# Patient Record
Sex: Female | Born: 1965 | Race: White | Hispanic: No | Marital: Married | Smoking: Former smoker
Health system: Southern US, Community
[De-identification: ages and names within clinical notes are randomized; demographics above are authoritative.]

## PROBLEM LIST (undated history)

## (undated) DIAGNOSIS — F32A Depression, unspecified: Secondary | ICD-10-CM

## (undated) DIAGNOSIS — F329 Major depressive disorder, single episode, unspecified: Secondary | ICD-10-CM

## (undated) DIAGNOSIS — F419 Anxiety disorder, unspecified: Secondary | ICD-10-CM

## (undated) DIAGNOSIS — T300 Burn of unspecified body region, unspecified degree: Secondary | ICD-10-CM

## (undated) HISTORY — DX: Major depressive disorder, single episode, unspecified: F32.9

## (undated) HISTORY — DX: Anxiety disorder, unspecified: F41.9

## (undated) HISTORY — DX: Depression, unspecified: F32.A

## (undated) HISTORY — PX: OTHER SURGICAL HISTORY: SHX169

## (undated) HISTORY — PX: GASTRIC BYPASS: SHX52

## (undated) HISTORY — PX: TONSILLECTOMY: SUR1361

---

## 2005-02-13 ENCOUNTER — Other Ambulatory Visit: Admission: RE | Admit: 2005-02-13 | Discharge: 2005-02-13 | Payer: Self-pay | Admitting: Obstetrics and Gynecology

## 2005-06-18 ENCOUNTER — Ambulatory Visit (HOSPITAL_COMMUNITY): Admission: RE | Admit: 2005-06-18 | Discharge: 2005-06-18 | Payer: Self-pay | Admitting: Obstetrics and Gynecology

## 2005-09-10 ENCOUNTER — Inpatient Hospital Stay (HOSPITAL_COMMUNITY): Admission: AD | Admit: 2005-09-10 | Discharge: 2005-09-12 | Payer: Self-pay | Admitting: Obstetrics and Gynecology

## 2006-12-29 ENCOUNTER — Emergency Department (HOSPITAL_COMMUNITY): Admission: EM | Admit: 2006-12-29 | Discharge: 2006-12-29 | Payer: Self-pay | Admitting: Emergency Medicine

## 2007-07-21 ENCOUNTER — Ambulatory Visit (HOSPITAL_COMMUNITY): Admission: RE | Admit: 2007-07-21 | Discharge: 2007-07-21 | Payer: Self-pay | Admitting: Family Medicine

## 2009-08-31 ENCOUNTER — Ambulatory Visit (HOSPITAL_COMMUNITY): Admission: RE | Admit: 2009-08-31 | Discharge: 2009-08-31 | Payer: Self-pay | Admitting: Family Medicine

## 2010-05-10 ENCOUNTER — Ambulatory Visit (HOSPITAL_COMMUNITY): Admission: RE | Admit: 2010-05-10 | Discharge: 2010-05-10 | Payer: Self-pay | Admitting: Family Medicine

## 2010-11-03 ENCOUNTER — Ambulatory Visit (HOSPITAL_COMMUNITY): Admission: RE | Admit: 2010-11-03 | Payer: Self-pay | Source: Home / Self Care | Admitting: Family Medicine

## 2010-11-08 ENCOUNTER — Other Ambulatory Visit: Payer: Self-pay

## 2010-11-08 DIAGNOSIS — Z139 Encounter for screening, unspecified: Secondary | ICD-10-CM

## 2010-11-14 ENCOUNTER — Ambulatory Visit (HOSPITAL_COMMUNITY)
Admission: RE | Admit: 2010-11-14 | Discharge: 2010-11-14 | Disposition: A | Discharge status: Home / Self Care | Payer: PRIVATE HEALTH INSURANCE | Source: Ambulatory Visit | Attending: Family Medicine | Admitting: Family Medicine

## 2010-11-14 DIAGNOSIS — Z139 Encounter for screening, unspecified: Secondary | ICD-10-CM

## 2010-11-14 DIAGNOSIS — Z1231 Encounter for screening mammogram for malignant neoplasm of breast: Secondary | ICD-10-CM | POA: Insufficient documentation

## 2011-01-29 ENCOUNTER — Other Ambulatory Visit (HOSPITAL_COMMUNITY): Payer: Self-pay | Admitting: Family Medicine

## 2011-01-29 DIAGNOSIS — Z139 Encounter for screening, unspecified: Secondary | ICD-10-CM

## 2011-02-05 ENCOUNTER — Emergency Department (HOSPITAL_COMMUNITY)
Admission: EM | Admit: 2011-02-05 | Discharge: 2011-02-05 | Disposition: A | Discharge status: Home / Self Care | Payer: Self-pay | Attending: Emergency Medicine | Admitting: Emergency Medicine

## 2011-02-05 DIAGNOSIS — Z139 Encounter for screening, unspecified: Secondary | ICD-10-CM | POA: Insufficient documentation

## 2011-02-05 LAB — DIFFERENTIAL
Basophils Absolute: 0 10*3/uL (ref 0.0–0.1)
Basophils Relative: 1 % (ref 0–1)
Eosinophils Absolute: 0.2 10*3/uL (ref 0.0–0.7)
Lymphocytes Relative: 21 % (ref 12–46)
Lymphs Abs: 1.6 10*3/uL (ref 0.7–4.0)
Monocytes Absolute: 0.7 10*3/uL (ref 0.1–1.0)
Monocytes Relative: 9 % (ref 3–12)
Neutro Abs: 5.1 10*3/uL (ref 1.7–7.7)

## 2011-02-05 LAB — BASIC METABOLIC PANEL
CO2: 23 mEq/L (ref 19–32)
Calcium: 9.3 mg/dL (ref 8.4–10.5)
Creatinine, Ser: 1.23 mg/dL — ABNORMAL HIGH (ref 0.4–1.2)
GFR calc Af Amer: 57 mL/min — ABNORMAL LOW (ref >60)
Potassium: 3.7 mEq/L (ref 3.5–5.1)

## 2011-02-05 LAB — CBC
MCH: 29.4 pg (ref 26.0–34.0)
MCHC: 31.9 g/dL (ref 30.0–36.0)
MCV: 92.1 fL (ref 78.0–100.0)

## 2011-02-05 LAB — RAPID URINE DRUG SCREEN, HOSP PERFORMED
Amphetamines: NOT DETECTED
Tetrahydrocannabinol: POSITIVE — AB

## 2011-02-23 NOTE — Discharge Summary (Signed)
[name], [name] NO.:  [id number]   MEDICAL RECORD NO.:  [id number]          PATIENT TYPE:  INP   LOCATION:  9127                          FACILITY:  [facility]   PHYSICIAN:  Malva Limes, M.D.    DATE OF BIRTH:  1966-08-04   DATE OF ADMISSION:  09/10/2005  DATE OF DISCHARGE:  09/12/2005                                 DISCHARGE SUMMARY   FINAL DIAGNOSES:  1.  Intrauterine gestation at term.  2.  Induction.  3.  Positive group B strep.  4.  Vacuum-assisted vaginal delivery.Marland Kitchen   PROCEDURE:  Vacuum-assisted vaginal delivery of a female infant Apgars of 8  and 9, delivery performed by Dr. Carrington Clamp.  Complications:  None.   This 45 year old G2, P0-0-1-0, was admitted for induction at term.  Her  antepartum course had been complicated by lithium exposure in the first  trimester.  The patient did have a fetal echo that was negative for defects.  She was also advanced maternal age and declined all tests including,  amniocentesis.  She did have anatomy scan at [facility], which was normal.  The  patient also was on this lithium for a rage personality disorder, probably  as well as depression and anxiety.  She has been stable on Zoloft throughout  her pregnancy.  I think the patient also had a history of drug abuse.  Denied any during pregnancy except for some early marijuana use, which she  said she stopped during her pregnancy.  Otherwise the patient's antepartum  course was just complicated by a positive group B strep culture.   She was admitted this time, started on penicillin for her positive group B  strep status.  AROM was performed with clear fluid.  She dilated to complete  and complete.  She pushed for about an hour and half.  The baby was started  to notice to suffer from some  decelerations with each contraction.  At this  point a vacuum extractor was placed and with two contractions and two pop-  offs, the patient finally pushed the baby out on the second  contraction.  She had a spontaneous vaginal delivery of an 8 pound 15 ounces female infant  without Apgars of 8 and 9.  There was a second-degree midline episiotomy  that was made with a third degree extension.  This was all repaired.  Delivery went without complications.  The patient's postpartum course was  benign without significant fevers.  She was seen by the clinical social work  team to talk about her depression and the risk of increasing her chances of  having postpartum depression.  Otherwise the patient was felt ready for  discharge on postpartum day #2, was sent home on a regular diet, told to  decrease activities, told to continue her prenatal vitamins and Zoloft 100  mg daily, was given Percocet one to two every four hours as needed for pain.  Was to follow up in the office in four to six weeks.   DISCHARGE LABORATORY DATA:  The patient had a hemoglobin of 11.2, white  blood cell count  of 16.7, platelets of 250,000.  The patient did not need  RhoGAM given even though she was Rh negative because the baby was Rh  negative as well.      Leilani Able, P.A.-C.    ______________________________  Malva Limes, M.D.    MB/MEDQ  D:  10/25/2005  T:  10/25/2005  Job:  [id number]

## 2011-04-19 ENCOUNTER — Ambulatory Visit (HOSPITAL_COMMUNITY): Payer: PRIVATE HEALTH INSURANCE | Admitting: Psychiatry

## 2011-10-24 ENCOUNTER — Other Ambulatory Visit (HOSPITAL_COMMUNITY): Payer: Self-pay | Admitting: Family Medicine

## 2011-10-24 DIAGNOSIS — Z139 Encounter for screening, unspecified: Secondary | ICD-10-CM

## 2011-11-26 ENCOUNTER — Ambulatory Visit (HOSPITAL_COMMUNITY)
Admission: RE | Admit: 2011-11-26 | Discharge: 2011-11-26 | Disposition: A | Discharge status: Home / Self Care | Payer: BC Managed Care – PPO | Source: Ambulatory Visit | Attending: Family Medicine | Admitting: Family Medicine

## 2011-11-26 DIAGNOSIS — Z1231 Encounter for screening mammogram for malignant neoplasm of breast: Secondary | ICD-10-CM | POA: Insufficient documentation

## 2011-11-26 DIAGNOSIS — Z139 Encounter for screening, unspecified: Secondary | ICD-10-CM

## 2013-01-20 ENCOUNTER — Other Ambulatory Visit (HOSPITAL_COMMUNITY): Payer: Self-pay | Admitting: Family Medicine

## 2013-01-20 DIAGNOSIS — Z139 Encounter for screening, unspecified: Secondary | ICD-10-CM

## 2013-01-27 ENCOUNTER — Ambulatory Visit (HOSPITAL_COMMUNITY)
Admission: RE | Admit: 2013-01-27 | Discharge: 2013-01-27 | Disposition: A | Discharge status: Home / Self Care | Payer: BC Managed Care – PPO | Source: Ambulatory Visit | Attending: Family Medicine | Admitting: Family Medicine

## 2013-01-27 DIAGNOSIS — Z139 Encounter for screening, unspecified: Secondary | ICD-10-CM

## 2013-01-27 DIAGNOSIS — Z1231 Encounter for screening mammogram for malignant neoplasm of breast: Secondary | ICD-10-CM | POA: Insufficient documentation

## 2013-03-10 ENCOUNTER — Ambulatory Visit (INDEPENDENT_AMBULATORY_CARE_PROVIDER_SITE_OTHER): Payer: BC Managed Care – PPO | Admitting: Psychology

## 2013-03-10 DIAGNOSIS — F632 Kleptomania: Secondary | ICD-10-CM

## 2013-03-10 DIAGNOSIS — F3130 Bipolar disorder, current episode depressed, mild or moderate severity, unspecified: Secondary | ICD-10-CM

## 2013-03-10 DIAGNOSIS — F411 Generalized anxiety disorder: Secondary | ICD-10-CM

## 2013-03-20 ENCOUNTER — Encounter (HOSPITAL_COMMUNITY): Payer: Self-pay | Admitting: Psychology
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
## 2013-04-15 ENCOUNTER — Ambulatory Visit (HOSPITAL_COMMUNITY): Payer: Self-pay | Admitting: Psychology

## 2013-06-17 ENCOUNTER — Ambulatory Visit (INDEPENDENT_AMBULATORY_CARE_PROVIDER_SITE_OTHER): Payer: BC Managed Care – PPO | Admitting: Psychology

## 2013-06-17 DIAGNOSIS — F411 Generalized anxiety disorder: Secondary | ICD-10-CM

## 2013-06-17 DIAGNOSIS — F3130 Bipolar disorder, current episode depressed, mild or moderate severity, unspecified: Secondary | ICD-10-CM

## 2013-06-17 DIAGNOSIS — F632 Kleptomania: Secondary | ICD-10-CM

## 2013-06-26 DIAGNOSIS — F632 Kleptomania: Secondary | ICD-10-CM | POA: Insufficient documentation

## 2013-06-26 DIAGNOSIS — F319 Bipolar disorder, unspecified: Secondary | ICD-10-CM | POA: Insufficient documentation

## 2013-08-21 ENCOUNTER — Encounter (HOSPITAL_COMMUNITY): Payer: Self-pay | Admitting: Psychology
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
## 2013-12-23 ENCOUNTER — Other Ambulatory Visit (HOSPITAL_COMMUNITY): Payer: Self-pay | Admitting: Family Medicine

## 2013-12-23 DIAGNOSIS — Z139 Encounter for screening, unspecified: Secondary | ICD-10-CM

## 2014-01-28 ENCOUNTER — Ambulatory Visit (HOSPITAL_COMMUNITY): Payer: Self-pay

## 2014-02-02 ENCOUNTER — Ambulatory Visit (HOSPITAL_COMMUNITY)
Admission: RE | Admit: 2014-02-02 | Discharge: 2014-02-02 | Disposition: A | Discharge status: Home / Self Care | Payer: PRIVATE HEALTH INSURANCE | Source: Ambulatory Visit | Attending: Family Medicine | Admitting: Family Medicine

## 2014-02-02 DIAGNOSIS — Z1231 Encounter for screening mammogram for malignant neoplasm of breast: Secondary | ICD-10-CM | POA: Insufficient documentation

## 2014-02-02 DIAGNOSIS — Z139 Encounter for screening, unspecified: Secondary | ICD-10-CM

## 2015-01-03 ENCOUNTER — Other Ambulatory Visit (HOSPITAL_COMMUNITY): Payer: Self-pay | Admitting: Family Medicine

## 2015-01-03 DIAGNOSIS — Z1231 Encounter for screening mammogram for malignant neoplasm of breast: Secondary | ICD-10-CM

## 2015-02-14 ENCOUNTER — Ambulatory Visit (HOSPITAL_COMMUNITY): Payer: Self-pay

## 2015-03-02 ENCOUNTER — Ambulatory Visit (HOSPITAL_COMMUNITY): Payer: Self-pay

## 2015-03-03 ENCOUNTER — Telehealth: Payer: Self-pay

## 2015-03-03 NOTE — Telephone Encounter (Signed)
Pt was referred by Rowan Blase, PA for a screening colonoscopy. LMOM for a return call.

## 2015-03-04 NOTE — Telephone Encounter (Signed)
PT left VM today that she is ready to schedule colonoscopy.

## 2015-03-08 NOTE — Telephone Encounter (Signed)
I called and was triaging the pt and her dog got loose, said she will call back.

## 2015-03-09 ENCOUNTER — Ambulatory Visit (HOSPITAL_COMMUNITY): Payer: Self-pay

## 2015-03-10 NOTE — Telephone Encounter (Signed)
SUPREP SPLIT DOSING- full LIQUIDS WITH BREAKFAST.   Full Liquid Diet A high-calorie, high-protein supplement should be used to meet your nutritional requirements when the full liquid diet is continued for more than 2 or 3 days. If this diet is to be used for an extended period of time (more than 7 days), a multivitamin should be considered.  Breads and Starches  Allowed: None are allowed except crackers WHOLE OR pureed (made into a thick, smooth soup) in soup.   Avoid: Any others.    Potatoes/Pasta/Rice  Allowed: ANY ITEM AS A SOUP OR SMALL PLATE OF MASHED POTATOES.       Vegetables  Allowed: Strained tomato or vegetable juice. Vegetables pureed in soup.   Avoid: Any others.    Fruit  Allowed: Any strained fruit juices and fruit drinks. Include 1 serving of citrus or vitamin C-enriched fruit juice daily.   Avoid: Any others.  Meat and Meat Substitutes  Allowed: Egg  Avoid: Any meat, fish, or fowl. All cheese.  Milk  Allowed: Milk beverages, including milk shakes and instant breakfast mixes. Smooth yogurt.   Avoid: Any others. Avoid dairy products if not tolerated.    Soups and Combination Foods  Allowed: Broth, strained cream soups. Strained, broth-based soups.   Avoid: Any others.    Desserts and Sweets  Allowed: flavored gelatin,plain ice cream, sherbet, smooth pudding, junket, fruit ices, frozen ice pops, pudding pops,, frozen fudge pops, chocolate syrup. Sugar, honey, jelly, syrup.   Avoid: Any others.  Fats and Oils  Allowed: Margarine, butter, cream, sour cream, oils.   Avoid: Any others.  Beverages  Allowed: All.   Avoid: None.  Condiments  Allowed: Iodized salt, pepper, spices, flavorings. Cocoa powder.   Avoid: Any others.    SAMPLE MEAL PLAN Breakfast   cup orange juice.   1 OR 2 EGGS   1 cup  milk.   1 cup beverage (coffee or tea).   Cream or sugar, if desired.    Midmorning Snack  2 SCRAMBLED OR HARD BOILED  EGG   Lunch  1 cup cream soup.    cup fruit juice.   1 cup milk.    cup custard.   1 cup beverage (coffee or tea).   Cream or sugar, if desired.    Midafternoon Snack  1 cup milk shake.  Dinner  1 cup cream soup.    cup fruit juice.   1 cup milk.    cup pudding.   1 cup beverage (coffee or tea).   Cream or sugar, if desired.  Evening Snack  1 cup supplement.  To increase calories, add sugar, cream, butter, or margarine if possible. Nutritional supplements will also increase the total calories.

## 2015-03-10 NOTE — Telephone Encounter (Signed)
Gastroenterology Pre-Procedure Review  Request Date: 03/03/2015 Requesting Physician: Rowan Blase, PA  PATIENT REVIEW QUESTIONS: The patient responded to the following health history questions as indicated:    1. Diabetes Melitis: no 2. Joint replacements in the past 12 months: no 3. Major health problems in the past 3 months: no 4. Has an artificial valve or MVP: no 5. Has a defibrillator: no 6. Has been advised in past to take antibiotics in advance of a procedure like teeth cleaning: no    MEDICATIONS & ALLERGIES:    Patient reports the following regarding taking any blood thinners:   Plavix? no Aspirin? no Coumadin? no  Patient confirms/reports the following medications:  Current Outpatient Prescriptions  Medication Sig Dispense Refill  . buPROPion (WELLBUTRIN SR) 200 MG 12 hr tablet Take 150 mg by mouth daily. Pt SAID SHE IS TAKING 150 MG IN THE AM DAILY    . calcium carbonate (OS-CAL) 600 MG TABS tablet Take 600 mg by mouth daily with breakfast.    . lithium carbonate 300 MG capsule Take 300 mg by mouth 2 (two) times daily with a meal.     . Multiple Vitamin (MULTIVITAMIN) tablet Take 1 tablet by mouth daily.     No current facility-administered medications for this visit.    Patient confirms/reports the following allergies:  No Known Allergies  No orders of the defined types were placed in this encounter.    AUTHORIZATION INFORMATION Primary Insurance:  ID #:   Group #:  Pre-Cert / Auth required:  Pre-Cert / Auth #:   Secondary Insurance:  ID #:   Group #:  Pre-Cert / Auth required:  Pre-Cert / Auth #:   SCHEDULE INFORMATION: Procedure has been scheduled as follows:  Date:              Time:   Location:   This Gastroenterology Pre-Precedure Review Form is being routed to the following provider(s): Barney Drain, MD

## 2015-03-14 ENCOUNTER — Other Ambulatory Visit: Payer: Self-pay

## 2015-03-14 DIAGNOSIS — Z1211 Encounter for screening for malignant neoplasm of colon: Secondary | ICD-10-CM

## 2015-03-14 NOTE — Telephone Encounter (Signed)
Pt is scheduled for 03/25/2015 at 1:30 PM with Dr. Oneida Alar.

## 2015-03-15 MED ORDER — NA SULFATE-K SULFATE-MG SULF 17.5-3.13-1.6 GM/177ML PO SOLN: 1.0000 | ORAL | Status: DC

## 2015-03-15 NOTE — Telephone Encounter (Signed)
Rx sent to the pharmacy and instructions mailed to pt.  

## 2015-03-18 ENCOUNTER — Ambulatory Visit (HOSPITAL_COMMUNITY)
Admission: RE | Admit: 2015-03-18 | Discharge: 2015-03-18 | Disposition: A | Discharge status: Home / Self Care | Payer: PRIVATE HEALTH INSURANCE | Source: Ambulatory Visit | Attending: Family Medicine | Admitting: Family Medicine

## 2015-03-18 DIAGNOSIS — Z1231 Encounter for screening mammogram for malignant neoplasm of breast: Secondary | ICD-10-CM | POA: Insufficient documentation

## 2015-03-23 ENCOUNTER — Other Ambulatory Visit: Payer: Self-pay | Admitting: Family Medicine

## 2015-03-23 DIAGNOSIS — R928 Other abnormal and inconclusive findings on diagnostic imaging of breast: Secondary | ICD-10-CM

## 2015-03-29 ENCOUNTER — Telehealth: Payer: Self-pay

## 2015-03-29 NOTE — Telephone Encounter (Signed)
I called [employer] and spoke to Mrs. Reino Bellis who said a PA is not required for screening colonoscopy. Reference # [id number].

## 2015-04-03 ENCOUNTER — Telehealth: Payer: Self-pay | Admitting: Internal Medicine

## 2015-04-03 NOTE — Telephone Encounter (Signed)
Patient called this afternoon. Admitted that she did not read instructions completely. Ate a substantial breakfast and lunch today. She called about pursuing a preparation for tomorrow's colonoscopy. I recommended we cancel her colonoscopy tomorrow and get her rescheduled. I've instructed her to call the office tomorrow to start this process.

## 2015-04-04 ENCOUNTER — Ambulatory Visit (HOSPITAL_COMMUNITY)
Admission: RE | Admit: 2015-04-04 | Payer: PRIVATE HEALTH INSURANCE | Source: Ambulatory Visit | Admitting: Gastroenterology

## 2015-04-04 ENCOUNTER — Encounter (HOSPITAL_COMMUNITY): Admission: RE | Payer: Self-pay | Source: Ambulatory Visit

## 2015-04-04 ENCOUNTER — Other Ambulatory Visit: Payer: Self-pay

## 2015-04-04 DIAGNOSIS — Z1211 Encounter for screening for malignant neoplasm of colon: Secondary | ICD-10-CM

## 2015-04-04 SURGERY — COLONOSCOPY
Anesthesia: Moderate Sedation

## 2015-04-04 NOTE — Telephone Encounter (Signed)
REVIEWED. Contact pt to reschedule.

## 2015-04-04 NOTE — Telephone Encounter (Signed)
Pt has been rescheduled for 05/02/2015 at 9:30 AM. New instructions have been mailed to her.

## 2015-04-12 ENCOUNTER — Encounter (HOSPITAL_COMMUNITY): Payer: Self-pay

## 2015-04-25 ENCOUNTER — Telehealth: Payer: Self-pay

## 2015-04-25 NOTE — Telephone Encounter (Signed)
I called pt to update her triage and she has not had any change in her medications.

## 2015-04-26 NOTE — Telephone Encounter (Signed)
REVIEWED-NO ADDITIONAL RECOMMENDATIONS. 

## 2015-05-02 ENCOUNTER — Ambulatory Visit (HOSPITAL_COMMUNITY)
Admission: RE | Admit: 2015-05-02 | Discharge: 2015-05-02 | Disposition: A | Discharge status: Home / Self Care | Payer: PRIVATE HEALTH INSURANCE | Source: Ambulatory Visit | Attending: Gastroenterology | Admitting: Gastroenterology

## 2015-05-02 ENCOUNTER — Encounter (HOSPITAL_COMMUNITY): Payer: Self-pay | Admitting: *Deleted

## 2015-05-02 ENCOUNTER — Encounter (HOSPITAL_COMMUNITY)
Admission: RE | Disposition: A | Discharge status: Home / Self Care | Payer: Self-pay | Source: Ambulatory Visit | Attending: Gastroenterology

## 2015-05-02 DIAGNOSIS — K648 Other hemorrhoids: Secondary | ICD-10-CM | POA: Insufficient documentation

## 2015-05-02 DIAGNOSIS — Z87891 Personal history of nicotine dependence: Secondary | ICD-10-CM | POA: Diagnosis not present

## 2015-05-02 DIAGNOSIS — Z1211 Encounter for screening for malignant neoplasm of colon: Secondary | ICD-10-CM | POA: Insufficient documentation

## 2015-05-02 DIAGNOSIS — D125 Benign neoplasm of sigmoid colon: Secondary | ICD-10-CM | POA: Diagnosis not present

## 2015-05-02 DIAGNOSIS — Z9884 Bariatric surgery status: Secondary | ICD-10-CM | POA: Diagnosis not present

## 2015-05-02 DIAGNOSIS — Z8371 Family history of colonic polyps: Secondary | ICD-10-CM | POA: Insufficient documentation

## 2015-05-02 DIAGNOSIS — Z79899 Other long term (current) drug therapy: Secondary | ICD-10-CM | POA: Diagnosis not present

## 2015-05-02 DIAGNOSIS — F329 Major depressive disorder, single episode, unspecified: Secondary | ICD-10-CM | POA: Insufficient documentation

## 2015-05-02 DIAGNOSIS — K635 Polyp of colon: Secondary | ICD-10-CM

## 2015-05-02 DIAGNOSIS — K621 Rectal polyp: Secondary | ICD-10-CM | POA: Insufficient documentation

## 2015-05-02 HISTORY — PX: COLONOSCOPY: SHX5424

## 2015-05-02 SURGERY — COLONOSCOPY
Anesthesia: Moderate Sedation

## 2015-05-02 MED ORDER — SODIUM CHLORIDE 0.9 % IV SOLN
INTRAVENOUS | Status: DC
  Administered 2015-05-02: 09:00:00 via INTRAVENOUS

## 2015-05-02 MED ORDER — STERILE WATER FOR IRRIGATION IR SOLN
Status: DC | PRN
  Administered 2015-05-02: 09:00:00

## 2015-05-02 MED ORDER — SODIUM CHLORIDE 0.9 % IV SOLN: INTRAVENOUS | Status: DC

## 2015-05-02 MED ORDER — MEPERIDINE HCL 100 MG/ML IJ SOLN
INTRAMUSCULAR | Status: DC | PRN
  Administered 2015-05-02 (×2): 25 mg

## 2015-05-02 MED ORDER — MIDAZOLAM HCL 5 MG/5ML IJ SOLN
INTRAMUSCULAR | Status: DC | PRN
  Administered 2015-05-02 (×2): 2 mg via INTRAVENOUS

## 2015-05-02 MED ORDER — MEPERIDINE HCL 100 MG/ML IJ SOLN
INTRAMUSCULAR | Status: AC
  Filled 2015-05-02: qty 2

## 2015-05-02 MED ORDER — MIDAZOLAM HCL 5 MG/5ML IJ SOLN
INTRAMUSCULAR | Status: AC
  Filled 2015-05-02: qty 10

## 2015-05-02 MED ORDER — PROMETHAZINE HCL 25 MG/ML IJ SOLN
25.0000 mg | Freq: Once | INTRAMUSCULAR | Status: AC
  Administered 2015-05-02: 25 mg via INTRAVENOUS

## 2015-05-02 MED ORDER — SODIUM CHLORIDE 0.9 % IJ SOLN
INTRAMUSCULAR | Status: AC
  Filled 2015-05-02: qty 3

## 2015-05-02 MED ORDER — PROMETHAZINE HCL 25 MG/ML IJ SOLN
INTRAMUSCULAR | Status: AC
  Filled 2015-05-02: qty 1

## 2015-05-02 NOTE — H&P (Signed)
  Primary Care Physician:  Purvis Kilts, MD Primary Gastroenterologist:  Dr. Oneida Alar  Pre-Procedure History & Physical: HPI:  Heather Rivas is a 49 y.o. female here for Marland.  Past Medical History  Diagnosis Date  . Anxiety   . Depression     Past Surgical History  Procedure Laterality Date  . Gastric bypass    . Tonsillectomy    . Cervical laser surgery      Prior to Admission medications   Medication Sig Start Date End Date Taking? Authorizing Provider  acetaminophen (TYLENOL) 500 MG tablet Take 1,000 mg by mouth every 6 (six) hours as needed for mild pain.   Yes Historical Provider, MD  buPROPion (WELLBUTRIN XL) 150 MG 24 hr tablet Take 150 mg by mouth daily.   Yes Historical Provider, MD  calcium carbonate (OS-CAL) 600 MG TABS tablet Take 600 mg by mouth daily with breakfast.   Yes Historical Provider, MD  lithium carbonate 300 MG capsule Take 300 mg by mouth daily.    Yes Historical Provider, MD  Multiple Vitamin (MULTIVITAMIN) tablet Take 1 tablet by mouth daily.   Yes Historical Provider, MD  Na Sulfate-K Sulfate-Mg Sulf (SUPREP BOWEL PREP) SOLN Take 1 kit by mouth as directed. 03/15/15  Yes Danie Binder, MD    Allergies as of 04/04/2015  . (No Known Allergies)    Family History  Problem Relation Age of Onset  . Alcohol abuse Father   . Alcohol abuse Paternal Uncle   . Drug abuse Paternal Uncle     History   Social History  . Marital Status: Married    Spouse Name: N/A  . Number of Children: N/A  . Years of Education: N/A   Occupational History  . Not on file.   Social History Main Topics  . Smoking status: Former Smoker -- 0.50 packs/day for 10 years    Types: Cigarettes  . Smokeless tobacco: Never Used  . Alcohol Use: No  . Drug Use: Yes    Special: Marijuana     Comment: Once weekly  . Sexual Activity: Yes   Other Topics Concern  . Not on file   Social History Narrative    Review of Systems: See HPI, otherwise  negative ROS   Physical Exam: BP 122/77 mmHg  Pulse 78  Temp(Src) 98.7 F (37.1 C) (Oral)  Resp 18  Ht $R'5\' 11"'He$  (1.803 m)  Wt 190 lb (86.183 kg)  BMI 26.51 kg/m2  SpO2 99%  LMP 04/29/2015 General:   Alert,  pleasant and cooperative in NAD Head:  Normocephalic and atraumatic. Neck:  Supple; Lungs:  Clear throughout to auscultation.    Heart:  Regular rate and rhythm. Abdomen:  Soft, nontender and nondistended. Normal bowel sounds, without guarding, and without rebound.   Neurologic:  Alert and  oriented x4;  grossly normal neurologically.  Impression/Plan:     SCREENING  Plan:  1. TCS TODAY

## 2015-05-02 NOTE — Discharge Instructions (Signed)
You had 2 polyps removed. You have SMALL internal hemorrhoids.   FOLLOW A HIGH FIBER DIET. AVOID ITEMS THAT CAUSE BLOATING & GAS. SEE INFO BELOW.  YOUR BIOPSY RESULTS WILL BE AVAILABLE IN MY CHART AFTER JUL 27 AND MY OFFICE WILL CONTACT YOU IN 10-14 DAYS WITH YOUR RESULTS.   Next colonoscopy in 5-10 years.   Colonoscopy Care After Read the instructions outlined below and refer to this sheet in the next week. These discharge instructions provide you with general information on caring for yourself after you leave the hospital. While your treatment has been planned according to the most current medical practices available, unavoidable complications occasionally occur. If you have any problems or questions after discharge, call DR. Catie Chiao, [phone].  ACTIVITY  You may resume your regular activity, but move at a slower pace for the next 24 hours.   Take frequent rest periods for the next 24 hours.   Walking will help get rid of the air and reduce the bloated feeling in your belly (abdomen).   No driving for 24 hours (because of the medicine (anesthesia) used during the test).   You may shower.   Do not sign any important legal documents or operate any machinery for 24 hours (because of the anesthesia used during the test).    NUTRITION  Drink plenty of fluids.   You may resume your normal diet as instructed by your doctor.   Begin with a light meal and progress to your normal diet. Heavy or fried foods are harder to digest and may make you feel sick to your stomach (nauseated).   Avoid alcoholic beverages for 24 hours or as instructed.    MEDICATIONS  You may resume your normal medications.   WHAT YOU CAN EXPECT TODAY  Some feelings of bloating in the abdomen.   Passage of more gas than usual.   Spotting of blood in your stool or on the toilet paper  .  IF YOU HAD POLYPS REMOVED DURING THE COLONOSCOPY:  Eat a soft diet IF YOU HAVE NAUSEA, BLOATING, ABDOMINAL  PAIN, OR VOMITING.    FINDING OUT THE RESULTS OF YOUR TEST Not all test results are available during your visit. DR. Oneida Alar WILL CALL YOU WITHIN 14 DAYS OF YOUR PROCEDUE WITH YOUR RESULTS. Do not assume everything is normal if you have not heard from DR. Savahna Casados, CALL HER OFFICE AT [phone].  SEEK IMMEDIATE MEDICAL ATTENTION AND CALL THE OFFICE: [phone] IF:  You have more than a spotting of blood in your stool.   Your belly is swollen (abdominal distention).   You are nauseated or vomiting.   You have a temperature over 101F.   You have abdominal pain or discomfort that is severe or gets worse throughout the day.   High-Fiber Diet A high-fiber diet changes your normal diet to include more whole grains, legumes, fruits, and vegetables. Changes in the diet involve replacing refined carbohydrates with unrefined foods. The calorie level of the diet is essentially unchanged. The Dietary Reference Intake (recommended amount) for adult males is 38 grams per day. For adult females, it is 25 grams per day. Pregnant and lactating women should consume 28 grams of fiber per day. Fiber is the intact part of a plant that is not broken down during digestion. Functional fiber is fiber that has been isolated from the plant to provide a beneficial effect in the body. PURPOSE  Increase stool bulk.   Ease and regulate bowel movements.   Lower cholesterol.  REDUCE  RISK OF COLON CANCER  INDICATIONS THAT YOU NEED MORE FIBER  Constipation and hemorrhoids.   Uncomplicated diverticulosis (intestine condition) and irritable bowel syndrome.   Weight management.   As a protective measure against hardening of the arteries (atherosclerosis), diabetes, and cancer.   GUIDELINES FOR INCREASING FIBER IN THE DIET  Start adding fiber to the diet slowly. A gradual increase of about 5 more grams (2 slices of whole-wheat bread, 2 servings of most fruits or vegetables, or 1 bowl of high-fiber cereal) per  day is best. Too rapid an increase in fiber may result in constipation, flatulence, and bloating.   Drink enough water and fluids to keep your urine clear or pale yellow. Water, juice, or caffeine-free drinks are recommended. Not drinking enough fluid may cause constipation.   Eat a variety of high-fiber foods rather than one type of fiber.   Try to increase your intake of fiber through using high-fiber foods rather than fiber pills or supplements that contain small amounts of fiber.   The goal is to change the types of food eaten. Do not supplement your present diet with high-fiber foods, but replace foods in your present diet.   INCLUDE A VARIETY OF FIBER SOURCES  Replace refined and processed grains with whole grains, canned fruits with fresh fruits, and incorporate other fiber sources. White rice, white breads, and most bakery goods contain little or no fiber.   Brown whole-grain rice, buckwheat oats, and many fruits and vegetables are all good sources of fiber. These include: broccoli, Brussels sprouts, cabbage, cauliflower, beets, sweet potatoes, white potatoes (skin on), carrots, tomatoes, eggplant, squash, berries, fresh fruits, and dried fruits.   Cereals appear to be the richest source of fiber. Cereal fiber is found in whole grains and bran. Bran is the fiber-rich outer coat of cereal grain, which is largely removed in refining. In whole-grain cereals, the bran remains. In breakfast cereals, the largest amount of fiber is found in those with "bran" in their names. The fiber content is sometimes indicated on the label.   You may need to include additional fruits and vegetables each day.   In baking, for 1 cup white flour, you may use the following substitutions:   1 cup whole-wheat flour minus 2 tablespoons.   1/2 cup white flour plus 1/2 cup whole-wheat flour.   Polyps, Colon  A polyp is extra tissue that grows inside your body. Colon polyps grow in the large intestine. The  large intestine, also called the colon, is part of your digestive system. It is a long, hollow tube at the end of your digestive tract where your body makes and stores stool. Most polyps are not dangerous. They are benign. This means they are not cancerous. But over time, some types of polyps can turn into cancer. Polyps that are smaller than a pea are usually not harmful. But larger polyps could someday become or may already be cancerous. To be safe, doctors remove all polyps and test them.   PREVENTION There is not one sure way to prevent polyps. You might be able to lower your risk of getting them if you:  Eat more fruits and vegetables and less fatty food.   Do not smoke.   Avoid alcohol.   Exercise every day.   Lose weight if you are overweight.   Eating more calcium and folate can also lower your risk of getting polyps. Some foods that are rich in calcium are milk, cheese, and broccoli. Some foods that are rich  in folate are chickpeas, kidney beans, and spinach.   Hemorrhoids Hemorrhoids are dilated (enlarged) veins around the rectum. Sometimes clots will form in the veins. This makes them swollen and painful. These are called thrombosed hemorrhoids. Causes of hemorrhoids include:  Constipation.   Straining to have a bowel movement.   HEAVY LIFTING  HOME CARE INSTRUCTIONS  Eat a well balanced diet and drink 6 to 8 glasses of water every day to avoid constipation. You may also use a bulk laxative.   Avoid straining to have bowel movements.   Keep anal area dry and clean.   Do not use a donut shaped pillow or sit on the toilet for long periods. This increases blood pooling and pain.   Move your bowels when your body has the urge; this will require less straining and will decrease pain and pressure.

## 2015-05-02 NOTE — Op Note (Signed)
[facility] [address] [place], [address]   COLONOSCOPY PROCEDURE REPORT  PATIENT: Heather Rivas, Heather Rivas  MR#: [id number] BIRTHDATE: 1965/11/19 , 48  yrs. old GENDER: female ENDOSCOPIST: Danie Binder, MD REFERRED [place]:GURK Hilma Favors, M.D. PROCEDURE DATE:  May 23, 2015 PROCEDURE:   Colonoscopy with cold biopsy polypectomy and Colonoscopy with snare polypectomy INDICATIONS:average risk patient for colon cancer-FATHER HAD POLYPS AGE > 60. MEDICATIONS: Promethazine (Phenergan) 25 mg IV, Demerol 50 mg IV, and Versed 4 mg IV  DESCRIPTION OF PROCEDURE:    Physical exam was performed.  Informed consent was obtained from the patient after explaining the benefits, risks, and alternatives to procedure.  The patient was connected to monitor and placed in left lateral position. Continuous oxygen was provided by nasal cannula and IV medicine administered through an indwelling cannula.  After administration of sedation and rectal exam, the patients rectum was intubated and the     colonoscope was advanced under direct visualization to the ileum.  The scope was removed slowly by carefully examining the color, texture, anatomy, and integrity mucosa on the way out.  The patient was recovered in endoscopy and discharged home in satisfactory condition. Estimated blood loss is zero unless otherwise noted in this procedure report.    COLON FINDINGS: The examined terminal ileum appeared to be normal. , The colon was redundant.  Manual abdominal counter-pressure was used to reach the cecum, A single polyp measuring 3 mm in size was found in the sigmoid colon.  A polypectomy was performed with cold forceps.  , A sessile polyp measuring 6 mm in size was found in the rectum.  A polypectomy was performed using snare cautery.  , and Small internal hemorrhoids were found.  PREP QUALITY: excellent.  CECAL W/D TIME: 10       minutes COMPLICATIONS: None  ENDOSCOPIC IMPRESSION: 1.   TWO POLYPS  REMOVED 2.   The colon was redundant 3.   Small internal hemorrhoids  RECOMMENDATIONS: AWAIT BIOPSY HIGH FIBER DIET NEXT TCS IN 5-10 YEARS   _______________________________ eSignedDanie Binder, MD 2015-05-23 10:15 AM   CPT CODES: ICD CODES:  The ICD and CPT codes recommended by this software are interpretations from the data that the clinical staff has captured with the software.  The verification of the translation of this report to the ICD and CPT codes and modifiers is the sole responsibility of the health care institution and practicing physician where this report was generated.  [employer] will not be held responsible for the validity of the ICD and CPT codes included on this report.  AMA assumes no liability for data contained or not contained herein. CPT is a Designer, television/film set of the [employer].

## 2015-05-03 ENCOUNTER — Telehealth: Payer: Self-pay | Admitting: Gastroenterology

## 2015-05-03 ENCOUNTER — Ambulatory Visit (HOSPITAL_COMMUNITY)
Admission: RE | Admit: 2015-05-03 | Discharge: 2015-05-03 | Disposition: A | Discharge status: Home / Self Care | Payer: PRIVATE HEALTH INSURANCE | Source: Ambulatory Visit | Attending: Family Medicine | Admitting: Family Medicine

## 2015-05-03 ENCOUNTER — Encounter (HOSPITAL_COMMUNITY): Payer: Self-pay | Admitting: Gastroenterology

## 2015-05-03 DIAGNOSIS — R928 Other abnormal and inconclusive findings on diagnostic imaging of breast: Secondary | ICD-10-CM | POA: Diagnosis not present

## 2015-05-03 NOTE — Telephone Encounter (Signed)
Please call pt. SHE had ONE INFLAMMATORY AND ONE polypoid lesion removed and THEY ARE benign.   FOLLOW A HIGH FIBER DIET. AVOID ITEMS THAT CAUSE BLOATING & GAS.  Next colonoscopy in 10 years.

## 2015-05-03 NOTE — Telephone Encounter (Signed)
NIC for TCS in 10 years

## 2015-05-04 NOTE — Telephone Encounter (Signed)
Pt is aware of results. 

## 2016-04-27 ENCOUNTER — Other Ambulatory Visit (HOSPITAL_COMMUNITY): Payer: Self-pay | Admitting: Family Medicine

## 2016-04-27 DIAGNOSIS — Z1231 Encounter for screening mammogram for malignant neoplasm of breast: Secondary | ICD-10-CM

## 2016-05-23 ENCOUNTER — Ambulatory Visit (HOSPITAL_COMMUNITY): Payer: Self-pay

## 2016-09-26 ENCOUNTER — Ambulatory Visit (HOSPITAL_COMMUNITY): Payer: Self-pay

## 2016-11-07 DIAGNOSIS — G43009 Migraine without aura, not intractable, without status migrainosus: Secondary | ICD-10-CM | POA: Insufficient documentation

## 2017-02-07 ENCOUNTER — Encounter (HOSPITAL_COMMUNITY): Payer: Self-pay | Admitting: Emergency Medicine

## 2017-02-07 ENCOUNTER — Emergency Department (HOSPITAL_COMMUNITY)
Admission: EM | Admit: 2017-02-07 | Discharge: 2017-02-08 | Disposition: A | Discharge status: Home / Self Care | Payer: Medicaid Other | Attending: Emergency Medicine | Admitting: Emergency Medicine

## 2017-02-07 ENCOUNTER — Emergency Department (HOSPITAL_COMMUNITY): Payer: Medicaid Other

## 2017-02-07 DIAGNOSIS — Z79899 Other long term (current) drug therapy: Secondary | ICD-10-CM | POA: Diagnosis not present

## 2017-02-07 DIAGNOSIS — R0789 Other chest pain: Secondary | ICD-10-CM | POA: Insufficient documentation

## 2017-02-07 DIAGNOSIS — Z87891 Personal history of nicotine dependence: Secondary | ICD-10-CM | POA: Diagnosis not present

## 2017-02-07 DIAGNOSIS — R079 Chest pain, unspecified: Secondary | ICD-10-CM

## 2017-02-07 LAB — CBC WITH DIFFERENTIAL/PLATELET
Basophils Absolute: 0.1 10*3/uL (ref 0.0–0.1)
Basophils Relative: 1 %
Eosinophils Absolute: 0.2 10*3/uL (ref 0.0–0.7)
Eosinophils Relative: 2 %
HCT: 35 % — ABNORMAL LOW (ref 36.0–46.0)
Hemoglobin: 11 g/dL — ABNORMAL LOW (ref 12.0–15.0)
Lymphocytes Relative: 26 %
Lymphs Abs: 2 10*3/uL (ref 0.7–4.0)
MCH: 25.3 pg — ABNORMAL LOW (ref 26.0–34.0)
MCHC: 31.4 g/dL (ref 30.0–36.0)
MCV: 80.6 fL (ref 78.0–100.0)
Monocytes Absolute: 0.9 10*3/uL (ref 0.1–1.0)
Monocytes Relative: 12 %
NEUTROS ABS: 4.6 10*3/uL (ref 1.7–7.7)
Neutrophils Relative %: 59 %
Platelets: 411 10*3/uL — ABNORMAL HIGH (ref 150–400)
RBC: 4.34 MIL/uL (ref 3.87–5.11)
RDW: 15.5 % (ref 11.5–15.5)
WBC: 7.8 10*3/uL (ref 4.0–10.5)

## 2017-02-07 LAB — BASIC METABOLIC PANEL
ANION GAP: 7 (ref 5–15)
BUN: 22 mg/dL — ABNORMAL HIGH (ref 6–20)
CHLORIDE: 113 mmol/L — AB (ref 101–111)
CO2: 19 mmol/L — ABNORMAL LOW (ref 22–32)
CREATININE: 0.93 mg/dL (ref 0.44–1.00)
Calcium: 9 mg/dL (ref 8.9–10.3)
GFR calc Af Amer: >60 mL/min (ref >60)
GFR calc non Af Amer: >60 mL/min (ref >60)
Glucose, Bld: 85 mg/dL (ref 65–99)
Potassium: 3.9 mmol/L (ref 3.5–5.1)
Sodium: 139 mmol/L (ref 135–145)

## 2017-02-07 LAB — TROPONIN I: Troponin I: <0.03 ng/mL (ref <0.03)

## 2017-02-07 NOTE — ED Provider Notes (Signed)
[facility] DEPT Provider Note   CSN: [id number] Arrival date & time: 02/07/17  2049     History   Chief Complaint Chief Complaint  Patient presents with  . Chest Pain    HPI Heather Rivas is a 51 y.o. female.  The history is provided by the patient. No language interpreter was used.  Chest Pain   This is a new problem. The problem occurs constantly. The problem has been gradually worsening. The pain is at a severity of 9/10. The pain is moderate. The pain does not radiate. Pertinent negatives include no abdominal pain and no back pain. There are no known risk factors.  Her past medical history is significant for anxiety/panic attacks.  Her family medical history is significant for CAD.  Pt reports she had chest pain for 15 minutes.  Pt reports she felt short of breath and sweaty.  Pt reports symptoms resolved after passing gas.  Pt reports she feels fine now. No pain  Past Medical History:  Diagnosis Date  . Anxiety   . Depression     Patient Active Problem List   Diagnosis Date Noted  . Special screening for malignant neoplasms, colon     Past Surgical History:  Procedure Laterality Date  . Cervical laser surgery    . COLONOSCOPY N/A 05/02/2015   Procedure: COLONOSCOPY;  Surgeon: Danie Binder, MD;  Location: [facility] SUITE;  Service: Endoscopy;  Laterality: N/A;  9:30 AM  . GASTRIC BYPASS    . TONSILLECTOMY      OB History    No data available       Home Medications    Prior to Admission medications   Medication Sig Start Date End Date Taking? Authorizing Provider  acetaminophen (TYLENOL 8 HOUR ARTHRITIS PAIN) 650 MG CR tablet Take 650 mg by mouth every 8 (eight) hours as needed for pain.   Yes Historical Provider, MD  buPROPion (WELLBUTRIN XL) 150 MG 24 hr tablet Take 300 mg by mouth daily.    Yes Historical Provider, MD  calcium carbonate (OS-CAL) 600 MG TABS tablet Take 600 mg by mouth daily with breakfast.   Yes Historical Provider, MD    diphenhydramine-acetaminophen (TYLENOL PM) 25-500 MG TABS tablet Take 1 tablet by mouth at bedtime as needed.   Yes Historical Provider, MD  hydrOXYzine (ATARAX/VISTARIL) 10 MG tablet Take 20 mg by mouth 3 (three) times daily as needed. For mild headache 11/07/16  Yes Historical Provider, MD  lithium carbonate 300 MG capsule Take 300 mg by mouth 2 (two) times daily.    Yes Historical Provider, MD  Multiple Vitamin (MULTIVITAMIN) tablet Take 1 tablet by mouth daily.   Yes Historical Provider, MD  NONFORMULARY OR COMPOUNDED ITEM Apply 1 application topically daily as needed (for arthritis in thumb(s)).   Yes Historical Provider, MD  valACYclovir (VALTREX) 500 MG tablet Take 2 tablets by mouth daily. As needed for outbreaks 01/28/17  Yes Historical Provider, MD    Family History Family History  Problem Relation Age of Onset  . Alcohol abuse Father   . Alcohol abuse Paternal Uncle   . Drug abuse Paternal Uncle     Social History Social History  Substance Use Topics  . Smoking status: Former Smoker    Packs/day: 0.50    Years: 10.00    Types: Cigarettes  . Smokeless tobacco: Never Used  . Alcohol use No     Allergies   Patient has no known allergies.   Review of Systems Review  of Systems  Cardiovascular: Positive for chest pain.  Gastrointestinal: Negative for abdominal pain.  Musculoskeletal: Negative for back pain.  All other systems reviewed and are negative.    Physical Exam Updated Vital Signs BP (!) 144/84   Pulse 74   Temp 97.6 F (36.4 C) (Oral)   Resp 20   Ht 5' 10.5" (1.791 m)   Wt 97.5 kg   LMP 06/27/2016   SpO2 98%   BMI 30.41 kg/m   Physical Exam  Constitutional: She appears well-developed and well-nourished. No distress.  HENT:  Head: Normocephalic and atraumatic.  Eyes: Conjunctivae are normal.  Neck: Neck supple.  Cardiovascular: Normal rate and regular rhythm.   No murmur heard. Pulmonary/Chest: Effort normal and breath sounds normal. No  respiratory distress.  Abdominal: Soft. There is no tenderness.  Musculoskeletal: She exhibits no edema.  Neurological: She is alert.  Skin: Skin is warm and dry.  Psychiatric: She has a normal mood and affect.  Nursing note and vitals reviewed.    ED Treatments / Results  Labs (all labs ordered are listed, but only abnormal results are displayed) Labs Reviewed  CBC WITH DIFFERENTIAL/PLATELET - Abnormal; Notable for the following:       Result Value   Hemoglobin 11.0 (*)    HCT 35.0 (*)    MCH 25.3 (*)    Platelets 411 (*)    All other components within normal limits  BASIC METABOLIC PANEL - Abnormal; Notable for the following:    Chloride 113 (*)    CO2 19 (*)    BUN 22 (*)    All other components within normal limits  TROPONIN I  TROPONIN I    EKG  EKG Interpretation None       Radiology Dg Chest 2 View  Result Date: 02/07/2017 CLINICAL DATA:  Shortness of breath and RIGHT lower chest pain for 40 minutes. EXAM: CHEST  2 VIEW COMPARISON:  None. FINDINGS: Cardiomediastinal silhouette is normal. No pleural effusions or focal consolidations. Trachea projects midline and there is no pneumothorax. Soft tissue planes and included osseous structures are non-suspicious. Mild degenerative change of the thoracic spine. Surgical clips anterior abdomen. IMPRESSION: Normal chest. Electronically Signed   By: Elon Alas M.D.   On: 02/07/2017 21:24    Procedures Procedures (including critical care time)  Medications Ordered in ED Medications - No data to display   Initial Impression / Assessment and Plan / ED Course  I have reviewed the triage vital signs and the nursing notes.  Pertinent labs & imaging results that were available during my care of the patient were reviewed by me and considered in my medical decision making (see chart for details).     Normal EKG, troponin negative x2 .   Pt has no current symptoms.  Pt advised to follow up with her MD for recheck.   Return if problems  Final Clinical Impressions(s) / ED Diagnoses   Final diagnoses:  Chest pain, unspecified type    New Prescriptions New Prescriptions   No medications on file  An After Visit Summary was printed and given to the patient.    Hollace Kinnier [place], PA-C 02/08/17 1044    Milton Ferguson, MD 02/08/17 [phone]

## 2017-02-07 NOTE — ED Triage Notes (Signed)
Pt c/o sudden chest heaviness with sob started approx 40 min ago. Pt arrived mild sob and diaphoretic and rating pain 9.  Pt has since calmed down and rating pain 4. A/o

## 2017-02-07 NOTE — ED Notes (Signed)
Patient transported to X-ray 

## 2017-02-08 LAB — TROPONIN I

## 2017-02-08 NOTE — ED Notes (Signed)
Pt ambulatory to waiting room. Pt verbalized understanding of discharge instructions.   

## 2017-02-08 NOTE — Discharge Instructions (Signed)
See your Physician for recheck.  Return if any problems.  °

## 2017-06-20 ENCOUNTER — Encounter (HOSPITAL_COMMUNITY): Payer: Self-pay | Admitting: *Deleted

## 2017-06-20 ENCOUNTER — Emergency Department (HOSPITAL_COMMUNITY)
Admission: EM | Admit: 2017-06-20 | Discharge: 2017-06-20 | Disposition: A | Discharge status: Home / Self Care | Payer: Medicaid Other | Attending: Emergency Medicine | Admitting: Emergency Medicine

## 2017-06-20 DIAGNOSIS — Z87891 Personal history of nicotine dependence: Secondary | ICD-10-CM | POA: Insufficient documentation

## 2017-06-20 DIAGNOSIS — Y990 Civilian activity done for income or pay: Secondary | ICD-10-CM | POA: Diagnosis not present

## 2017-06-20 DIAGNOSIS — T310 Burns involving less than 10% of body surface: Secondary | ICD-10-CM | POA: Diagnosis not present

## 2017-06-20 DIAGNOSIS — Z79899 Other long term (current) drug therapy: Secondary | ICD-10-CM | POA: Insufficient documentation

## 2017-06-20 DIAGNOSIS — Y93G1 Activity, food preparation and clean up: Secondary | ICD-10-CM | POA: Insufficient documentation

## 2017-06-20 DIAGNOSIS — T22132A Burn of first degree of left upper arm, initial encounter: Secondary | ICD-10-CM | POA: Diagnosis not present

## 2017-06-20 DIAGNOSIS — Y929 Unspecified place or not applicable: Secondary | ICD-10-CM | POA: Diagnosis not present

## 2017-06-20 DIAGNOSIS — T24211A Burn of second degree of right thigh, initial encounter: Secondary | ICD-10-CM | POA: Diagnosis not present

## 2017-06-20 DIAGNOSIS — S79821A Other specified injuries of right thigh, initial encounter: Secondary | ICD-10-CM | POA: Diagnosis present

## 2017-06-20 DIAGNOSIS — X12XXXA Contact with other hot fluids, initial encounter: Secondary | ICD-10-CM | POA: Insufficient documentation

## 2017-06-20 DIAGNOSIS — T300 Burn of unspecified body region, unspecified degree: Secondary | ICD-10-CM

## 2017-06-20 MED ORDER — ACETAMINOPHEN 500 MG PO TABS
1000.0000 mg | ORAL_TABLET | Freq: Once | ORAL | Status: AC
  Administered 2017-06-20: 1000 mg via ORAL
  Filled 2017-06-20: qty 2

## 2017-06-20 MED ORDER — HYDROCODONE-ACETAMINOPHEN 5-325 MG PO TABS: 2.0000 | ORAL_TABLET | ORAL | 0 refills | Status: DC | PRN

## 2017-06-20 MED ORDER — SILVER SULFADIAZINE 1 % EX CREA: 1.0000 "application " | TOPICAL_CREAM | Freq: Two times a day (BID) | CUTANEOUS | 0 refills | Status: DC

## 2017-06-20 MED ORDER — SILVER SULFADIAZINE 1 % EX CREA
TOPICAL_CREAM | Freq: Once | CUTANEOUS | Status: AC
  Administered 2017-06-20: 1 via TOPICAL
  Filled 2017-06-20: qty 50

## 2017-06-20 NOTE — ED Provider Notes (Signed)
[facility] DEPT Provider Note   CSN: [id number] Arrival date & time: 06/20/17  2002     History   Chief Complaint Chief Complaint  Patient presents with  . Burn    HPI Heather Rivas is a 51 y.o. female who presents today for evaluation of burns. She reports that the burns occurred while she was at work when she accidentally dropped a pot of boiling potatoes. She reports that she has burns to her right anterior thigh, and that it splashed onto her left arm.  She reports her last tetanus shot was within the past 5 years.  She denies any allergies to medications, however states that she cannot take ibuprofen as she has had a previous gastric bypass. Her burn occurred shortly prior to arrival. She has not tried any interventions prior to arrival. She reports pain associated with her burns.   HPI  Past Medical History:  Diagnosis Date  . Anxiety   . Depression     Patient Active Problem List   Diagnosis Date Noted  . Special screening for malignant neoplasms, colon     Past Surgical History:  Procedure Laterality Date  . Cervical laser surgery    . COLONOSCOPY N/A 05/02/2015   Procedure: COLONOSCOPY;  Surgeon: Danie Binder, MD;  [facility]: [facility];  Service: Endoscopy;  Laterality: N/A;  9:30 AM  . GASTRIC BYPASS    . TONSILLECTOMY      OB History    No data available       Home Medications    Prior to Admission medications   Medication Sig Start Date End Date Taking? Authorizing Provider  acetaminophen (TYLENOL 8 HOUR ARTHRITIS PAIN) 650 MG CR tablet Take 650 mg by mouth every 8 (eight) hours as needed for pain.    [provider]  buPROPion (WELLBUTRIN XL) 150 MG 24 hr tablet Take 300 mg by mouth daily.     [provider]  calcium carbonate (OS-CAL) 600 MG TABS tablet Take 600 mg by mouth daily with breakfast.    [provider]  diphenhydramine-acetaminophen (TYLENOL PM) 25-500 MG TABS tablet Take 1 tablet by mouth at bedtime  as needed.    [provider]  HYDROcodone-acetaminophen (NORCO/VICODIN) 5-325 MG tablet Take 2 tablets by mouth every 4 (four) hours as needed. 06/20/17   Lorin Glass, PA-C  hydrOXYzine (ATARAX/VISTARIL) 10 MG tablet Take 20 mg by mouth 3 (three) times daily as needed. For mild headache 11/07/16   [provider]  lithium carbonate 300 MG capsule Take 300 mg by mouth 2 (two) times daily.     [provider]  Multiple Vitamin (MULTIVITAMIN) tablet Take 1 tablet by mouth daily.    [provider]  NONFORMULARY OR COMPOUNDED ITEM Apply 1 application topically daily as needed (for arthritis in thumb(s)).    [provider]  silver sulfADIAZINE (SILVADENE) 1 % cream Apply 1 application topically 2 (two) times daily. Apply a thin layer to the burned areas twice daily 06/20/17   Lorin Glass, PA-C  valACYclovir (VALTREX) 500 MG tablet Take 2 tablets by mouth daily. As needed for outbreaks 01/28/17   [provider]    Family History Family History  Problem Relation Age of Onset  . Alcohol abuse Father   . Alcohol abuse Paternal Uncle   . Drug abuse Paternal Uncle     Social History Social History  Substance Use Topics  . Smoking status: Former Smoker    Packs/day: 0.50  Years: 10.00    Types: Cigarettes  . Smokeless tobacco: Never Used  . Alcohol use No     Allergies   Patient has no known allergies.   Review of Systems Review of Systems  Constitutional: Negative for fever.  Respiratory: Negative for chest tightness and shortness of breath.   Gastrointestinal: Negative for nausea.  Skin: Positive for color change and wound. Negative for pallor and rash.  Neurological: Negative for numbness and headaches.  All other systems reviewed and are negative.    Physical Exam Updated Vital Signs BP 127/73 (BP Location: Right Arm)   Pulse 84   Temp 98.2 F (36.8 C) (Oral)   Resp 20   Ht 5\' 11"  (1.803 m)   Wt 99.8  kg (220 lb)   LMP 06/27/2016   SpO2 97%   BMI 30.68 kg/m   Physical Exam  Constitutional: She appears well-developed and well-nourished. No distress.  HENT:  Head: Normocephalic and atraumatic.  Mouth/Throat: Oropharynx is clear and moist. No oropharyngeal exudate.  Cardiovascular: Normal rate and intact distal pulses.   Pulmonary/Chest: Effort normal. No stridor. No respiratory distress.  Skin: Skin is warm and dry. She is not diaphoretic.  Burns present to right anterior thigh, and left upper arm. Please see pictures.  There are multiple blisters present within the burn on her right anterior thigh.  She still has sensation in this area.  Nursing note and vitals reviewed.          ED Treatments / Results  Labs (all labs ordered are listed, but only abnormal results are displayed) Labs Reviewed - No data to display  EKG  EKG Interpretation None       Radiology No results found.  Procedures Procedures (including critical care time)  Medications Ordered in ED Medications  silver sulfADIAZINE (SILVADENE) 1 % cream (1 application Topical Given 06/20/17 2058)  acetaminophen (TYLENOL) tablet 1,000 mg (1,000 mg Oral Given 06/20/17 2111)     Initial Impression / Assessment and Plan / ED Course  I have reviewed the triage vital signs and the nursing notes.  Pertinent labs & imaging results that were available during my care of the patient were reviewed by me and considered in my medical decision making (see chart for details).    Heather Rivas presents for evaluation of burns to her right anterior thigh, and left upper arm that occurred shortly prior to arrival at work when she dropped a boiling pot of water on herself.  She has what appears to be first-degree splash burns on the left upper arm. She has second-degree burns with blistering and surrounding first-degree burns to her right anterior thigh.  Distal circulation, sensation, and motor function are all intact.  Her last tetanus shot was within the past 5 years.  She was offered pain medicine however states that she has to drive home, and therefore was given Tylenol for pain.   Her wounds were dressed with silver Silvadene and nonstick dressings. She was instructed to return to the emergency room in 2 days for wound recheck, she may follow up with her PCP if they are open. She was instructed to return sooner if she has any concerns. She was given strict return precautions, and states her understanding. She was given a prescription for sulfa Silvadene, and instructions on how to dress her burns at home.  She was given a short course of Norco for her pain.  She was informed that narcotics can be addictive and that she needs  to use them sparingly.  While patient does have a history of narcotic abuse she and I discussed the potential risks and benefits of a prescription. She has what appears to be a large painful burn that I feel warrants narcotics.  Patient accepted the prescription for narcotics.  Prior to the prescription for narcotics to use at home the [facility] controlled substance reporting system was queried for the patient with the birth date and address listed, and all prescriptions for the past year were reviewed.  This patient was seen as a shared visit with Dr. Lacinda Axon who evaluated the patient and agrees with my plan.   Final Clinical Impressions(s) / ED Diagnoses   Final diagnoses:  Burn    New Prescriptions New Prescriptions   HYDROCODONE-ACETAMINOPHEN (NORCO/VICODIN) 5-325 MG TABLET    Take 2 tablets by mouth every 4 (four) hours as needed.   SILVER SULFADIAZINE (SILVADENE) 1 % CREAM    Apply 1 application topically 2 (two) times daily. Apply a thin layer to the burned areas twice daily     Ollen Gross 06/20/17 2137    Nat Christen, MD 06/22/17 [phone]

## 2017-06-20 NOTE — ED Triage Notes (Signed)
Pt dropped a pot of boiling potatoes on her right thigh while at work.

## 2017-06-20 NOTE — Discharge Instructions (Signed)
All narcotics can be addicting.  It is important that you are aware this and do not use more of your prescription pain medicine then you need to.  Afterwards please properly dispose of your pain medication. Please do not keep it for future use.  Please take Tylenol (acetaminophen) to relieve your pain.  You make take tylenol, up to 1,000 mg (two extra strength pills).  Do not take more than 4,000 mg tylenol in a 24 hour period.  Your prescription pain medication contains Tylenol, and you need to include this in your total daily allowance of Tylenol. Please check all medication labels as many medications such as pain and cold medications may contain tylenol.  Do not drink alcohol while taking these medications.     You are being prescribed a medication which may make you sleepy. For 24 hours after one dose please do not drive, operate heavy machinery, care for a small child with out another adult present, or perform any activities that may cause harm to you or someone else if you were to fall asleep or be impaired.

## 2017-06-20 NOTE — ED Provider Notes (Deleted)
[facility] DEPT Provider Note   CSN: [id number] Arrival date & time: 06/20/17  2002     History   Chief Complaint Chief Complaint  Patient presents with  . Burn    HPI Heather Rivas is a 51 y.o. female who presents for evaluation of burns.  She reports that the burns occurred while she was at work when she dropped a pot of boiling water on her right thigh.  She reports the splash burnt her right arm.    HPI  Past Medical History:  Diagnosis Date  . Anxiety   . Depression     Patient Active Problem List   Diagnosis Date Noted  . Special screening for malignant neoplasms, colon     Past Surgical History:  Procedure Laterality Date  . Cervical laser surgery    . COLONOSCOPY N/A 05/02/2015   Procedure: COLONOSCOPY;  Surgeon: Heather Binder, MD;  [facility];  Service: Endoscopy;  Laterality: N/A;  9:30 AM  . GASTRIC BYPASS    . TONSILLECTOMY      OB History    No data available       Home Medications    Prior to Admission medications   Medication Sig Start Date End Date Taking? Authorizing Provider  acetaminophen (TYLENOL 8 HOUR ARTHRITIS PAIN) 650 MG CR tablet Take 650 mg by mouth every 8 (eight) hours as needed for pain.    [provider]  buPROPion (WELLBUTRIN XL) 150 MG 24 hr tablet Take 300 mg by mouth daily.     [provider]  calcium carbonate (OS-CAL) 600 MG TABS tablet Take 600 mg by mouth daily with breakfast.    [provider]  diphenhydramine-acetaminophen (TYLENOL PM) 25-500 MG TABS tablet Take 1 tablet by mouth at bedtime as needed.    [provider]  HYDROcodone-acetaminophen (NORCO/VICODIN) 5-325 MG tablet Take 2 tablets by mouth every 4 (four) hours as needed. 06/20/17   Heather Glass, PA-C  hydrOXYzine (ATARAX/VISTARIL) 10 MG tablet Take 20 mg by mouth 3 (three) times daily as needed. For mild headache 11/07/16   [provider]  lithium carbonate 300 MG capsule Take 300 mg by  mouth 2 (two) times daily.     [provider]  Multiple Vitamin (MULTIVITAMIN) tablet Take 1 tablet by mouth daily.    [provider]  NONFORMULARY OR COMPOUNDED ITEM Apply 1 application topically daily as needed (for arthritis in thumb(s)).    [provider]  silver sulfADIAZINE (SILVADENE) 1 % cream Apply 1 application topically 2 (two) times daily. Apply a thin layer to the burned areas twice daily 06/20/17   Heather Glass, PA-C  valACYclovir (VALTREX) 500 MG tablet Take 2 tablets by mouth daily. As needed for outbreaks 01/28/17   [provider]    Family History Family History  Problem Relation Age of Onset  . Alcohol abuse Father   . Alcohol abuse Paternal Uncle   . Drug abuse Paternal Uncle     Social History Social History  Substance Use Topics  . Smoking status: Former Smoker    Packs/day: 0.50    Years: 10.00    Types: Cigarettes  . Smokeless tobacco: Never Used  . Alcohol use No     Allergies   Patient has no known allergies.   Review of Systems Review of Systems  Constitutional: Negative for chills and fever.  Respiratory: Negative for chest tightness, shortness of breath and wheezing.   Gastrointestinal: Negative for  nausea and vomiting.  Skin: Positive for color change and wound. Negative for pallor and rash.  Neurological: Negative for headaches.     Physical Exam Updated Vital Signs BP 127/73 (BP Location: Right Arm)   Pulse 84   Temp 98.2 F (36.8 C) (Oral)   Resp 20   Ht 5\' 11"  (1.803 m)   Wt 99.8 kg (220 lb)   LMP 06/27/2016   SpO2 97%   BMI 30.68 kg/m   Physical Exam  Constitutional: She appears well-developed and well-nourished.  HENT:  Head: Normocephalic and atraumatic.  Eyes: Conjunctivae are normal.  Neck: Neck supple. No JVD present.  Cardiovascular: Normal rate and intact distal pulses.   Pulmonary/Chest: Effort normal and breath sounds normal. No stridor. No respiratory distress. She  has no wheezes.  Musculoskeletal: Normal range of motion. She exhibits no edema or deformity.  Neurological: She is alert. No sensory deficit.  Skin: Skin is warm and dry. She is not diaphoretic.  There are burns present to patient's left upper arm, and right anterior thigh. The burns on patient's left upper arm are erythematous without obvious blister formation, appear consistent with first-degree burns. Her burns are not circumferential, Do not involve the flexor aspect of any joints, and are not on her hand.  The burns on her right anterior thigh are approximately 3-4% total body surface area, there is areas of both erythema and multiple blisters present, largest of which is approximately 4 cm x 2 cm.  Patient has feeling throughout the burned area.  Her burn is not circumferential.   She has intact distal pulses and circulation.  There is no burns across her joints.   Please see pictures.  Psychiatric: Thought content normal.  Nursing note and vitals reviewed.          ED Treatments / Results  Labs (all labs ordered are listed, but only abnormal results are displayed) Labs Reviewed - No data to display  EKG  EKG Interpretation None       Radiology No results found.  Procedures Procedures (including critical care time)  Medications Ordered in ED Medications  silver sulfADIAZINE (SILVADENE) 1 % cream (1 application Topical Given 06/20/17 2058)  acetaminophen (TYLENOL) tablet 1,000 mg (1,000 mg Oral Given 06/20/17 2111)     Initial Impression / Assessment and Plan / ED Course  I have reviewed the triage vital signs and the nursing notes.  Pertinent labs & imaging results that were available during my care of the patient were reviewed by me and considered in my medical decision making (see chart for details).    Heather Rivas presents for evaluation of burns that occurred to her right anterior thigh and her left upper arm.  Her TBSA with second or third degree burns  is about 3-4%.  Her burns were treated with silver Silvadene cream, and dressed with non adhesive dressings.  Patient's tetanus is up-to-date. As patient was seen in a shared visit with Heather Rivas.  The patient was given discharge instructions, instructed to return precautions. She is to return here in 2 days for wound recheck. Patient was given prescriptions for Silvadene cream, and for norco.  Prior to the prescription for home narcotic medication the [place] controlled substance reporting database was queried and prescriptions were reviewed.    Due to mechanism I am not concerned about inhalational injuries. Patient does not have burns to her hands, feet, face, head, or genitals.  Patient does not appear to require emergent transfer to  a burn center.     At this time there does not appear to be any evidence of an acute emergency medical condition and the patient appears stable for discharge with appropriate outpatient follow up.Diagnosis was discussed with patient who verbalizes understanding and is agreeable to discharge. Pt case discussed with and seen by Heather Rivas who agrees with my plan.    Final Clinical Impressions(s) / ED Diagnoses   Final diagnoses:  Burn    New Prescriptions Discharge Medication List as of 06/20/2017  9:28 PM    START taking these medications   Details  HYDROcodone-acetaminophen (NORCO/VICODIN) 5-325 MG tablet Take 2 tablets by mouth every 4 (four) hours as needed., Starting Thu 06/20/2017, Print    silver sulfADIAZINE (SILVADENE) 1 % cream Apply 1 application topically 2 (two) times daily. Apply a thin layer to the burned areas twice daily, Starting Thu 06/20/2017, Print         Heather Rivas, [place] 06/21/17 0155

## 2017-06-23 ENCOUNTER — Encounter (HOSPITAL_COMMUNITY): Payer: Self-pay

## 2017-06-23 ENCOUNTER — Emergency Department (HOSPITAL_COMMUNITY)
Admission: EM | Admit: 2017-06-23 | Discharge: 2017-06-23 | Disposition: A | Discharge status: Home / Self Care | Payer: Medicaid Other | Attending: Emergency Medicine | Admitting: Emergency Medicine

## 2017-06-23 DIAGNOSIS — Y998 Other external cause status: Secondary | ICD-10-CM | POA: Diagnosis not present

## 2017-06-23 DIAGNOSIS — Z09 Encounter for follow-up examination after completed treatment for conditions other than malignant neoplasm: Secondary | ICD-10-CM

## 2017-06-23 DIAGNOSIS — T24231D Burn of second degree of right lower leg, subsequent encounter: Secondary | ICD-10-CM | POA: Diagnosis not present

## 2017-06-23 DIAGNOSIS — Z79899 Other long term (current) drug therapy: Secondary | ICD-10-CM | POA: Insufficient documentation

## 2017-06-23 DIAGNOSIS — Z87891 Personal history of nicotine dependence: Secondary | ICD-10-CM | POA: Insufficient documentation

## 2017-06-23 DIAGNOSIS — Y929 Unspecified place or not applicable: Secondary | ICD-10-CM | POA: Diagnosis not present

## 2017-06-23 DIAGNOSIS — Y279XXD Contact with unspecified hot objects, undetermined intent, subsequent encounter: Secondary | ICD-10-CM | POA: Insufficient documentation

## 2017-06-23 HISTORY — DX: Burn of unspecified body region, unspecified degree: T30.0

## 2017-06-23 MED ORDER — SILVER SULFADIAZINE 1 % EX CREA
TOPICAL_CREAM | Freq: Once | CUTANEOUS | Status: AC
  Administered 2017-06-23: 1 via TOPICAL
  Filled 2017-06-23: qty 50

## 2017-06-23 NOTE — ED Triage Notes (Signed)
Here for wound recheck from 2nd degree burn. States she was told to come back for recheck for infection of burn. Reports blister has popped and is leaking at this time. Patient denies any fevers.

## 2017-06-23 NOTE — ED Provider Notes (Signed)
[facility] DEPT Provider Note   CSN: [id number] Arrival date & time: 06/23/17  1132     History   Chief Complaint Chief Complaint  Patient presents with  . Wound Check    HPI Heather Rivas is a 51 y.o. female.  The history is provided by the patient. No language interpreter was used.  Wound Check  This is a recurrent problem. The current episode started 2 days ago. The problem occurs constantly. The problem has been gradually worsening. Nothing aggravates the symptoms. Nothing relieves the symptoms. She has tried nothing for the symptoms.  Pt reports she burned herself 2 days ago.  Pt reports blisters have burst.  Past Medical History:  Diagnosis Date  . Anxiety   . Burn   . Depression     Patient Active Problem List   Diagnosis Date Noted  . Special screening for malignant neoplasms, colon     Past Surgical History:  Procedure Laterality Date  . Cervical laser surgery    . COLONOSCOPY N/A 05/02/2015   Procedure: COLONOSCOPY;  Surgeon: Danie Binder, MD;  Location: [facility];  Service: Endoscopy;  Laterality: N/A;  9:30 AM  . GASTRIC BYPASS    . TONSILLECTOMY      OB History    No data available       Home Medications    Prior to Admission medications   Medication Sig Start Date End Date Taking? Authorizing Provider  acetaminophen (TYLENOL 8 HOUR ARTHRITIS PAIN) 650 MG CR tablet Take 650 mg by mouth every 8 (eight) hours as needed for pain.    [provider]  buPROPion (WELLBUTRIN XL) 150 MG 24 hr tablet Take 300 mg by mouth daily.     [provider]  calcium carbonate (OS-CAL) 600 MG TABS tablet Take 600 mg by mouth daily with breakfast.    [provider]  diphenhydramine-acetaminophen (TYLENOL PM) 25-500 MG TABS tablet Take 1 tablet by mouth at bedtime as needed.    [provider]  HYDROcodone-acetaminophen (NORCO/VICODIN) 5-325 MG tablet Take 2 tablets by mouth every 4 (four) hours as needed. 06/20/17    Lorin Glass, PA-C  hydrOXYzine (ATARAX/VISTARIL) 10 MG tablet Take 20 mg by mouth 3 (three) times daily as needed. For mild headache 11/07/16   [provider]  lithium carbonate 300 MG capsule Take 300 mg by mouth 2 (two) times daily.     [provider]  Multiple Vitamin (MULTIVITAMIN) tablet Take 1 tablet by mouth daily.    [provider]  NONFORMULARY OR COMPOUNDED ITEM Apply 1 application topically daily as needed (for arthritis in thumb(s)).    [provider]  silver sulfADIAZINE (SILVADENE) 1 % cream Apply 1 application topically 2 (two) times daily. Apply a thin layer to the burned areas twice daily 06/20/17   Lorin Glass, PA-C  valACYclovir (VALTREX) 500 MG tablet Take 2 tablets by mouth daily. As needed for outbreaks 01/28/17   [provider]    Family History Family History  Problem Relation Age of Onset  . Alcohol abuse Father   . Alcohol abuse Paternal Uncle   . Drug abuse Paternal Uncle     Social History Social History  Substance Use Topics  . Smoking status: Former Smoker    Packs/day: 0.50    Years: 10.00    Types: Cigarettes  . Smokeless tobacco: Never Used  . Alcohol use No     Allergies   Patient has no known allergies.  Review of Systems Review of Systems  All other systems reviewed and are negative.    Physical Exam Updated Vital Signs BP 114/79   Pulse 74   Temp 98.3 F (36.8 C) (Oral)   Resp 18   Ht 5\' 11"  (1.803 m)   Wt 99.8 kg (220 lb)   LMP 06/27/2016   SpO2 96%   BMI 30.68 kg/m   Physical Exam  Constitutional: She appears well-developed.  Musculoskeletal: Normal range of motion.  2nd degree burn right leg,   Ruptured blisters  Neurological: She is alert.  Skin: Skin is warm.  Psychiatric: She has a normal mood and affect.  Nursing note and vitals reviewed.    ED Treatments / Results  Labs (all labs ordered are listed, but only abnormal results are  displayed) Labs Reviewed - No data to display  EKG  EKG Interpretation None       Radiology No results found.  Procedures .Burn Treatment Date/Time: 06/23/2017 1:16 PM Performed by: Fransico Meadow Authorized by: Fransico Meadow   Consent:    Consent obtained:  Verbal   Consent given by:  Patient   Risks discussed:  Bleeding   Alternatives discussed:  No treatment Procedure details:    Total body burn percentage - superficial :  1   Total body burn percentage - partial/full:  1 Burn area 1 details:    Burn depth:  Partial thickness (2nd)   Affected area:  Lower extremity   Lower extremity location:  R leg   Debridement performed: yes     Debridement mechanism:  Scissors   Indications for debridement: devitalized blisters     Wound treatment:  Silver sulfadiazine   Dressing:  Bulky dressing Post-procedure details:    Patient tolerance of procedure:  Tolerated well, no immediate complications   (including critical care time)  Medications Ordered in ED Medications  silver sulfADIAZINE (SILVADENE) 1 % cream (1 application Topical Given 06/23/17 1252)     Initial Impression / Assessment and Plan / ED Course  I have reviewed the triage vital signs and the nursing notes.  Pertinent labs & imaging results that were available during my care of the patient were reviewed by me and considered in my medical decision making (see chart for details).      Final Clinical Impressions(s) / ED Diagnoses   Final diagnoses:  Encounter for recheck of burn    New Prescriptions Discharge Medication List as of 06/23/2017 12:28 PM    Recheck with Dr. Hilma Favors in 2 days An After Visit Summary was printed and given to the patient.   Fransico Meadow, [place] 06/23/17 1318    Margette Fast, MD 06/23/17 2024

## 2017-06-23 NOTE — Discharge Instructions (Signed)
Return if any problems. See Dr. Hilma Favors for recheck on Tuesday

## 2017-06-23 NOTE — ED Notes (Signed)
Poured hot water and potatoes at work onto R upper thigh on Thurs Now burn is redder and pain has increased Washed wound with dial soap and has been applying her silvadene as directed

## 2017-08-20 ENCOUNTER — Encounter (HOSPITAL_COMMUNITY): Payer: Self-pay | Admitting: Emergency Medicine

## 2017-08-20 ENCOUNTER — Emergency Department (HOSPITAL_COMMUNITY)
Admission: EM | Admit: 2017-08-20 | Discharge: 2017-08-20 | Disposition: A | Discharge status: Home / Self Care | Payer: Medicaid Other | Attending: Emergency Medicine | Admitting: Emergency Medicine

## 2017-08-20 ENCOUNTER — Emergency Department (HOSPITAL_COMMUNITY): Payer: Medicaid Other

## 2017-08-20 DIAGNOSIS — R079 Chest pain, unspecified: Secondary | ICD-10-CM | POA: Insufficient documentation

## 2017-08-20 DIAGNOSIS — Z87891 Personal history of nicotine dependence: Secondary | ICD-10-CM | POA: Insufficient documentation

## 2017-08-20 LAB — BASIC METABOLIC PANEL
ANION GAP: 9 (ref 5–15)
BUN: 19 mg/dL (ref 6–20)
CHLORIDE: 105 mmol/L (ref 101–111)
CO2: 24 mmol/L (ref 22–32)
Calcium: 9.6 mg/dL (ref 8.9–10.3)
Creatinine, Ser: 0.83 mg/dL (ref 0.44–1.00)
GFR calc Af Amer: >60 mL/min (ref >60)
GFR calc non Af Amer: >60 mL/min (ref >60)
GLUCOSE: 94 mg/dL (ref 65–99)
POTASSIUM: 3.8 mmol/L (ref 3.5–5.1)
Sodium: 138 mmol/L (ref 135–145)

## 2017-08-20 LAB — TROPONIN I
Troponin I: <0.03 ng/mL (ref <0.03)
Troponin I: <0.03 ng/mL (ref <0.03)

## 2017-08-20 LAB — CBC
HEMATOCRIT: 35.3 % — AB (ref 36.0–46.0)
HEMOGLOBIN: 11.1 g/dL — AB (ref 12.0–15.0)
MCH: 25.8 pg — AB (ref 26.0–34.0)
MCHC: 31.4 g/dL (ref 30.0–36.0)
MCV: 82.1 fL (ref 78.0–100.0)
Platelets: 359 10*3/uL (ref 150–400)
RBC: 4.3 MIL/uL (ref 3.87–5.11)
RDW: 15.9 % — ABNORMAL HIGH (ref 11.5–15.5)
WBC: 6.8 10*3/uL (ref 4.0–10.5)

## 2017-08-20 MED ORDER — FAMOTIDINE 20 MG PO TABS
20.0000 mg | ORAL_TABLET | Freq: Once | ORAL | Status: AC
  Administered 2017-08-20: 20 mg via ORAL
  Filled 2017-08-20: qty 1

## 2017-08-20 MED ORDER — RANITIDINE HCL 150 MG PO TABS: 150.0000 mg | ORAL_TABLET | Freq: Two times a day (BID) | ORAL | 0 refills | Status: DC

## 2017-08-20 MED ORDER — SIMETHICONE 80 MG PO CHEW
80.0000 mg | CHEWABLE_TABLET | Freq: Once | ORAL | Status: AC
  Administered 2017-08-20: 80 mg via ORAL
  Filled 2017-08-20: qty 1

## 2017-08-20 MED ORDER — RANITIDINE HCL 150 MG/10ML PO SYRP
150.0000 mg | ORAL_SOLUTION | Freq: Once | ORAL | Status: DC
Start: 2017-08-20 — End: 2017-08-20

## 2017-08-20 MED ORDER — GI COCKTAIL ~~LOC~~
30.0000 mL | Freq: Once | ORAL | Status: AC
  Administered 2017-08-20: 30 mL via ORAL
  Filled 2017-08-20: qty 30

## 2017-08-20 NOTE — ED Triage Notes (Signed)
Pt reports mid cp started 20 minutes ago. She has had this before and was told she had gas. Pt reports CP is easing and now has to burp. Relief with burping.

## 2017-08-20 NOTE — ED Provider Notes (Signed)
Emergency Department Provider Note   I have reviewed the triage vital signs and the nursing notes.   HISTORY  Chief Complaint Chest Pain   HPI Heather Rivas is a 51 y.o. female without any significant past medical history the presents to the emergency department today with pressure tthat started in her mid lower abdomen and slowly got worse causing pressure in epigastric area and subsequently up into her middle chest.  States this feels like previous episodes of gas however she was not able to have any flatulence or burping and the pain got worse so she came here for further evaluation.  Patient waited a few hours prior to my evaluation and during that time she had multiple episodes of burping and flatulence which seemed to help the symptoms quite a bit.  States that she did elevate shortness of breath because of the level of pressure but no nausea, vomiting diaphoresis or cough.  No other associated symptoms.  Has not tried anything for the symptoms this time.  Does not have any medical history aside from anxiety.  No recent illnesses.  Past Medical History:  Diagnosis Date  . Anxiety   . Burn   . Depression     Patient Active Problem List   Diagnosis Date Noted  . Special screening for malignant neoplasms, colon     Past Surgical History:  Procedure Laterality Date  . Cervical laser surgery    . GASTRIC BYPASS    . TONSILLECTOMY      Current Outpatient Rx  . Order #: [id number] Class: Historical Med  . Order #: [id number] Class: Historical Med  . Order #: [id number] Class: Historical Med  . Order #: [id number] Class: Historical Med  . Order #: [id number] Class: Historical Med  . Order #: [id number] Class: Historical Med  . Order #: [id number] Class: Historical Med  . Order #: [id number] Class: Historical Med  . Order #: [id number] Class: Historical Med  . Order #: [id number] Class: Print    Allergies Patient has no known allergies.  Family History  Problem Relation Age of Onset  .  Alcohol abuse Father   . Alcohol abuse Paternal Uncle   . Drug abuse Paternal Uncle     Social History Social History   Tobacco Use  . Smoking status: Former Smoker    Packs/day: 0.50    Years: 10.00    Pack years: 5.00    Types: Cigarettes  . Smokeless tobacco: Never Used  Substance Use Topics  . Alcohol use: No  . Drug use: Yes    Types: Marijuana    Comment: occasionally    Review of Systems  All other systems negative except as documented in the HPI. All pertinent positives and negatives as reviewed in the HPI. ____________________________________________   PHYSICAL EXAM:  VITAL SIGNS: ED Triage Vitals [08/20/17 1311]  Enc Vitals Group     BP (!) 150/96     Pulse Rate 76     Resp 16     Temp 98.5 F (36.9 C)     Temp Source Oral     SpO2 100 %     Weight 215 lb (97.5 kg)     Height 5\' 11"  (1.803 m)     Head Circumference      Peak Flow      Pain Score 6     Pain Loc      Pain Edu?      Excl. in [place]?     Constitutional: Alert and oriented. Well appearing and in no acute  distress. Eyes: Conjunctivae are normal. PERRL. EOMI. Head: Atraumatic. Nose: No congestion/rhinnorhea. Mouth/Throat: Mucous membranes are moist.  Oropharynx non-erythematous. Neck: No stridor.  No meningeal signs.   Cardiovascular: Normal rate, regular rhythm. Good peripheral circulation. Grossly normal heart sounds.   Respiratory: Normal respiratory effort.  No retractions. Lungs CTAB. Gastrointestinal: Soft and nontender. No distention.  Musculoskeletal: No lower extremity tenderness nor edema. No gross deformities of extremities. Neurologic:  Normal speech and language. No gross focal neurologic deficits are appreciated.  Skin:  Skin is warm, dry and intact. No rash noted.   ____________________________________________   LABS (all labs ordered are listed, but only abnormal results are displayed)  Labs Reviewed  CBC - Abnormal; Notable for the following components:       Result Value   Hemoglobin 11.1 (*)    HCT 35.3 (*)    MCH 25.8 (*)    RDW 15.9 (*)    All other components within normal limits  BASIC METABOLIC PANEL  TROPONIN I  TROPONIN I   ____________________________________________  EKG   EKG Interpretation  Date/Time:  Tuesday August 20 2017 13:07:55 EST Ventricular Rate:  68 PR Interval:  156 QRS Duration: 88 QT Interval:  386 QTC Calculation: 410 R Axis:   14 Text Interpretation:  Normal sinus rhythm Normal ECG No significant change since last tracing Confirmed by Merrily Pew [phone]) on 08/20/2017 6:00:28 PM       ____________________________________________  RADIOLOGY  Dg Chest 2 View  Result Date: 08/20/2017 CLINICAL DATA:  Chest tightness and weakness. EXAM: CHEST  2 VIEW COMPARISON:  02/07/2017 FINDINGS: The cardiomediastinal silhouette is within normal limits. The lungs are well inflated and clear. There is no evidence of pleural effusion or pneumothorax. No acute osseous abnormality is identified. IMPRESSION: No active cardiopulmonary disease. Electronically Signed   By: Logan Bores M.D.   On: 08/20/2017 13:44    ____________________________________________   PROCEDURES  Procedure(s) performed:   Procedures   ____________________________________________   INITIAL IMPRESSION / ASSESSMENT AND PLAN / ED COURSE  Pertinent labs & imaging results that were available during my care of the patient were reviewed by me and considered in my medical decision making (see chart for details).  Normal EKG and normal delta troponins with a history that sounds like GI related pain I feel the patient is stable for discharge.  I attempted to make her symptoms better however patient wanted to leave so she was discharged.  Low suspicion for ACS at this time.  Story is not really consistent with a pulmonary embolus.  Wanted to add on a lipase and test however patient felt that since her symptoms were getting so much better with  passing of gas she did not feel the need for these labs.  Patient will follow up with primary doctor for further workup.   ____________________________________________  FINAL CLINICAL IMPRESSION(S) / ED DIAGNOSES  Final diagnoses:  Nonspecific chest pain     MEDICATIONS GIVEN DURING THIS VISIT:  Medications  simethicone (MYLICON) chewable tablet 80 mg (80 mg Oral Given 08/20/17 1823)  gi cocktail (Maalox,Lidocaine,Donnatal) (30 mLs Oral Given 08/20/17 1824)  famotidine (PEPCID) tablet 20 mg (20 mg Oral Given 08/20/17 1822)     NEW OUTPATIENT MEDICATIONS STARTED DURING THIS VISIT:  This SmartLink is deprecated. Use AVSMEDLIST instead to display the medication list for a patient.  Note:  This document was prepared using Dragon voice recognition software and may include unintentional dictation errors.   Merrily Pew, MD 08/20/17 1850

## 2019-05-20 ENCOUNTER — Other Ambulatory Visit: Payer: Self-pay | Admitting: Family Medicine

## 2019-05-20 DIAGNOSIS — Z1231 Encounter for screening mammogram for malignant neoplasm of breast: Secondary | ICD-10-CM

## 2019-07-06 ENCOUNTER — Ambulatory Visit: Payer: Medicaid Other

## 2019-08-18 ENCOUNTER — Encounter: Payer: Medicaid Other | Admitting: Adult Health

## 2020-07-25 ENCOUNTER — Other Ambulatory Visit (HOSPITAL_COMMUNITY): Payer: Self-pay | Admitting: Internal Medicine

## 2020-07-25 DIAGNOSIS — Z1231 Encounter for screening mammogram for malignant neoplasm of breast: Secondary | ICD-10-CM

## 2020-07-25 DIAGNOSIS — M810 Age-related osteoporosis without current pathological fracture: Secondary | ICD-10-CM

## 2021-04-17 ENCOUNTER — Inpatient Hospital Stay (HOSPITAL_COMMUNITY): Admission: RE | Admit: 2021-04-17 | Payer: Medicaid Other | Source: Ambulatory Visit

## 2021-07-24 ENCOUNTER — Ambulatory Visit: Payer: BC Managed Care – PPO | Admitting: Orthopedic Surgery

## 2021-07-24 ENCOUNTER — Encounter: Payer: Self-pay | Admitting: Orthopedic Surgery

## 2021-07-24 ENCOUNTER — Other Ambulatory Visit: Payer: Self-pay

## 2021-07-24 ENCOUNTER — Ambulatory Visit: Payer: BC Managed Care – PPO

## 2021-07-24 VITALS — BP 124/81 | HR 70 | Ht 71.0 in | Wt 224.0 lb

## 2021-07-24 DIAGNOSIS — M79672 Pain in left foot: Secondary | ICD-10-CM | POA: Diagnosis not present

## 2021-07-24 NOTE — Patient Instructions (Signed)
While we are working on your approval for CT scan please go ahead and call to schedule your appointment with [facility] within at least one (1) week.   Central Scheduling [phone]

## 2021-07-24 NOTE — Progress Notes (Signed)
NEW PROBLEM//OFFICE VISIT   Chief Complaint  Patient presents with   Foot Pain    Left    55 year old female status post gastric bypass currently on 10,000 units of vitamin D per week along with calcium presents with 2-year history of lateral left foot pain near the fifth metatarsal.  She reports an injury about 2 years ago thought she was fine but has had persistent pain which radiates up into the ankle area.  Pain occurs across the subtalar joint as well.    ROS: Pain right and left thumb  BP 124/81   Pulse 70   Ht 5\' 11"  (1.803 m)   Wt 224 lb (101.6 kg)   LMP 06/27/2016   BMI 31.24 kg/m   Body mass index is 31.24 kg/m.  General appearance: Well-developed well-nourished no gross deformities  Cardiovascular normal pulse and perfusion normal color without edema  Neurologically no sensation loss or deficits or pathologic reflexes  Psychological: Awake alert and oriented x3 mood and affect normal  Skin no lacerations or ulcerations no nodularity no palpable masses, no erythema or nodularity  Musculoskeletal: Tenderness over the fifth metatarsal nontender over the peroneal's subtalar joint mild tenderness range of motion normal trace anterior drawer but stable endpoint no inversion pain      Past Medical History:  Diagnosis Date   Anxiety    Burn    Depression     Past Surgical History:  Procedure Laterality Date   Cervical laser surgery     COLONOSCOPY N/A 05/02/2015   Procedure: COLONOSCOPY;  Surgeon: Danie Binder, MD;  [facility];  Service: Endoscopy;  Laterality: N/A;  9:30 AM   GASTRIC BYPASS     TONSILLECTOMY      Family History  Problem Relation Age of Onset   Alcohol abuse Father    Alcohol abuse Paternal Uncle    Drug abuse Paternal Uncle    Social History   Tobacco Use   Smoking status: Former    Packs/day: 0.50    Years: 10.00    Pack years: 5.00    Types: Cigarettes   Smokeless tobacco: Never  Vaping Use   Vaping Use:  Never used  Substance Use Topics   Alcohol use: No   Drug use: Yes    Types: Marijuana    Comment: occasionally    No Known Allergies  Current Meds  Medication Sig   acetaminophen (TYLENOL) 650 MG CR tablet Take 650 mg by mouth every 8 (eight) hours as needed for pain.   Ascorbic Acid (VITAMIN C) 1000 MG tablet Take 1,000 mg by mouth daily.   buPROPion (WELLBUTRIN XL) 150 MG 24 hr tablet Take by mouth.   calcium carbonate (OS-CAL) 600 MG TABS tablet Take 600 mg by mouth daily with breakfast.   cholecalciferol (VITAMIN D3) 25 MCG (1000 UNIT) tablet Take 1,000 Units by mouth daily.   ferrous sulfate 325 (65 FE) MG tablet Take 325 mg by mouth daily with breakfast.   Multiple Vitamin (MULTIVITAMIN) tablet Take 1 tablet by mouth daily.   NONFORMULARY OR COMPOUNDED ITEM Apply 1 application topically daily as needed (for arthritis in thumb(s)).   vitamin B-12 (CYANOCOBALAMIN) 500 MCG tablet Take 500 mcg by mouth daily.   zinc gluconate 50 MG tablet Take 50 mg by mouth daily.   [DISCONTINUED] diphenhydramine-acetaminophen (TYLENOL PM) 25-500 MG TABS tablet Take 1 tablet by mouth at bedtime as needed.   [DISCONTINUED] ranitidine (ZANTAC) 150 MG tablet Take 1 tablet (150 mg total)  2 (two) times daily by mouth.     MEDICAL DECISION MAKING  A.  Encounter Diagnosis  Name Primary?   Pain in left foot Yes    B. DATA ANALYSED:   IMAGING: Interpretation of images: I have personally reviewed the images and my interpretation is internal left foot x-ray negative  Orders: CT scan ordered  Outside records reviewed: None   C. MANAGEMENT   CT scan to check for occult fracture  Patient at risk for osteoporosis and poor bone turnover secondary to the bypass.  No orders of the defined types were placed in this encounter.    Arther Abbott, MD  07/24/2021 9:34 AM

## 2021-08-03 ENCOUNTER — Other Ambulatory Visit (HOSPITAL_COMMUNITY): Payer: Self-pay | Admitting: Family Medicine

## 2021-08-03 DIAGNOSIS — Z1231 Encounter for screening mammogram for malignant neoplasm of breast: Secondary | ICD-10-CM

## 2021-08-03 DIAGNOSIS — Z780 Asymptomatic menopausal state: Secondary | ICD-10-CM

## 2021-08-24 ENCOUNTER — Telehealth: Payer: Self-pay | Admitting: Radiology

## 2021-08-24 NOTE — Telephone Encounter (Signed)
Left message for her to call back about her insurance coverage looks like her [employer] is inactive

## 2021-08-28 ENCOUNTER — Ambulatory Visit (HOSPITAL_COMMUNITY): Payer: Self-pay

## 2021-09-04 ENCOUNTER — Encounter: Payer: Self-pay | Admitting: Adult Health

## 2021-11-20 ENCOUNTER — Ambulatory Visit (HOSPITAL_COMMUNITY): Payer: Self-pay

## 2021-11-20 ENCOUNTER — Other Ambulatory Visit (HOSPITAL_COMMUNITY): Payer: Self-pay

## 2021-12-04 ENCOUNTER — Encounter (HOSPITAL_COMMUNITY): Payer: Self-pay

## 2021-12-04 ENCOUNTER — Ambulatory Visit (HOSPITAL_COMMUNITY)
Admission: RE | Admit: 2021-12-04 | Discharge: 2021-12-04 | Disposition: A | Discharge status: Home / Self Care | Payer: Managed Care, Other (non HMO) | Source: Ambulatory Visit | Attending: Family Medicine | Admitting: Family Medicine

## 2021-12-04 ENCOUNTER — Other Ambulatory Visit: Payer: Self-pay

## 2021-12-04 DIAGNOSIS — Z780 Asymptomatic menopausal state: Secondary | ICD-10-CM | POA: Insufficient documentation

## 2021-12-04 DIAGNOSIS — Z1231 Encounter for screening mammogram for malignant neoplasm of breast: Secondary | ICD-10-CM | POA: Insufficient documentation

## 2021-12-12 ENCOUNTER — Other Ambulatory Visit: Payer: Managed Care, Other (non HMO) | Admitting: Adult Health

## 2022-03-13 ENCOUNTER — Ambulatory Visit: Payer: Managed Care, Other (non HMO) | Admitting: Orthopaedic Surgery

## 2022-03-27 ENCOUNTER — Ambulatory Visit: Payer: Managed Care, Other (non HMO) | Admitting: Orthopaedic Surgery

## 2022-04-03 ENCOUNTER — Ambulatory Visit: Payer: Managed Care, Other (non HMO) | Admitting: Orthopaedic Surgery

## 2022-04-17 ENCOUNTER — Ambulatory Visit (INDEPENDENT_AMBULATORY_CARE_PROVIDER_SITE_OTHER): Payer: Managed Care, Other (non HMO) | Admitting: Orthopaedic Surgery

## 2022-04-17 ENCOUNTER — Ambulatory Visit (INDEPENDENT_AMBULATORY_CARE_PROVIDER_SITE_OTHER): Payer: Managed Care, Other (non HMO)

## 2022-04-17 DIAGNOSIS — M1812 Unilateral primary osteoarthritis of first carpometacarpal joint, left hand: Secondary | ICD-10-CM | POA: Diagnosis not present

## 2022-04-17 DIAGNOSIS — M79642 Pain in left hand: Secondary | ICD-10-CM | POA: Diagnosis not present

## 2022-04-17 NOTE — Progress Notes (Signed)
Office Visit Note   Patient: Heather Rivas           Date of Birth: 1966-03-11           MRN: [id number] Visit Date: 04/17/2022              Requested by: Heather Rivas, [address] [place],  [place] [id number] PCP: Heather Robert, FNP   Assessment & Plan: Visit Diagnoses:  1. Primary osteoarthritis of first carpometacarpal joint of left hand     Plan: Impression is advanced left thumb CMC.  Condition reviewed and treatment options were discussed.  She would like to try Voltaren gel and continue wearing supportive braces.  She will follow-up if she decides she wants to try an injection.  We also briefly talked about surgical treatment looks like.  Follow-Up Instructions: No follow-ups on file.   Orders:  Orders Placed This Encounter  Procedures   XR Finger Thumb Left   No orders of the defined types were placed in this encounter.     Procedures: No procedures performed   Clinical Data: No additional findings.   Subjective: Chief Complaint  Patient presents with   Left Hand - Pain    HPI Heather Rivas is a very pleasant 56 year old female right-hand-dominant works as a sous-chef in [place] comes in with 8 months of left thumb pain.  Wears a brace at work.  Feels some weakness in grip and pinch.  Can only take Tylenol due to gastric bypass.  Mother is Heather Rivas who is also one of my patients.  Review of Systems  Constitutional: Negative.   HENT: Negative.    Eyes: Negative.   Respiratory: Negative.    Cardiovascular: Negative.   Endocrine: Negative.   Musculoskeletal: Negative.   Neurological: Negative.   Hematological: Negative.   Psychiatric/Behavioral: Negative.    All other systems reviewed and are negative.    Objective: Vital Signs: LMP 06/27/2016   Physical Exam Vitals and nursing note reviewed.  Constitutional:      Appearance: She is well-developed.  HENT:     Head: Normocephalic and atraumatic.     Nose: Nose normal.   Eyes:     Extraocular Movements: Extraocular movements intact.  Cardiovascular:     Pulses: Normal pulses.  Pulmonary:     Effort: Pulmonary effort is normal.  Abdominal:     Palpations: Abdomen is soft.  Musculoskeletal:     Cervical back: Neck supple.  Skin:    General: Skin is warm.     Capillary Refill: Capillary refill takes less than 2 seconds.  Neurological:     Mental Status: She is alert and oriented to person, place, and time. Mental status is at baseline.  Psychiatric:        Behavior: Behavior normal.        Thought Content: Thought content normal.        Judgment: Judgment normal.     Ortho Exam Examination of the left hand shows pain without crepitus with grind test.  No triggering.  Negative Finkelstein's. Specialty Comments:  No specialty comments available.  Imaging: XR Finger Thumb Left  Result Date: 04/17/2022 Advanced thumb CMC osteoarthritis.     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   Past Medical History:  Diagnosis Date   Anxiety    Burn    Depression     Family History  Problem Relation Age of Onset   Alcohol abuse Father    Breast cancer Maternal Aunt    Alcohol abuse Paternal Uncle    Drug abuse Paternal Uncle    Breast cancer Maternal Grandmother     Past Surgical History:  Procedure Laterality Date   Cervical laser surgery     COLONOSCOPY N/A 05/02/2015   Procedure: COLONOSCOPY;  Surgeon: Heather Binder, MD;  [facility];  Service: Endoscopy;  Laterality: N/A;  9:30 AM   GASTRIC BYPASS     TONSILLECTOMY     Social History   Occupational History   Not on file  Tobacco Use   Smoking status: Former    Packs/day:  0.50    Years: 10.00    Total pack years: 5.00    Types: Cigarettes   Smokeless tobacco: Never  Vaping Use   Vaping Use: Never used  Substance and Sexual Activity   Alcohol use: No   Drug use: Yes    Types: Marijuana    Comment: occasionally   Sexual activity: Yes

## 2022-07-07 IMAGING — MG MM DIGITAL SCREENING BILAT W/ TOMO AND CAD
8 series · 8 of 24 positions shown · non-contrast
Comparison: None.

CLINICAL DATA: Screening.

EXAM:
DIGITAL SCREENING BILATERAL MAMMOGRAM WITH TOMOSYNTHESIS AND CAD
TECHNIQUE: Bilateral screening digital craniocaudal and mediolateral oblique
mammograms were obtained. Bilateral screening digital breast
tomosynthesis was performed. The images were evaluated with
computer-aided detection.

[L MLO synth-2D]
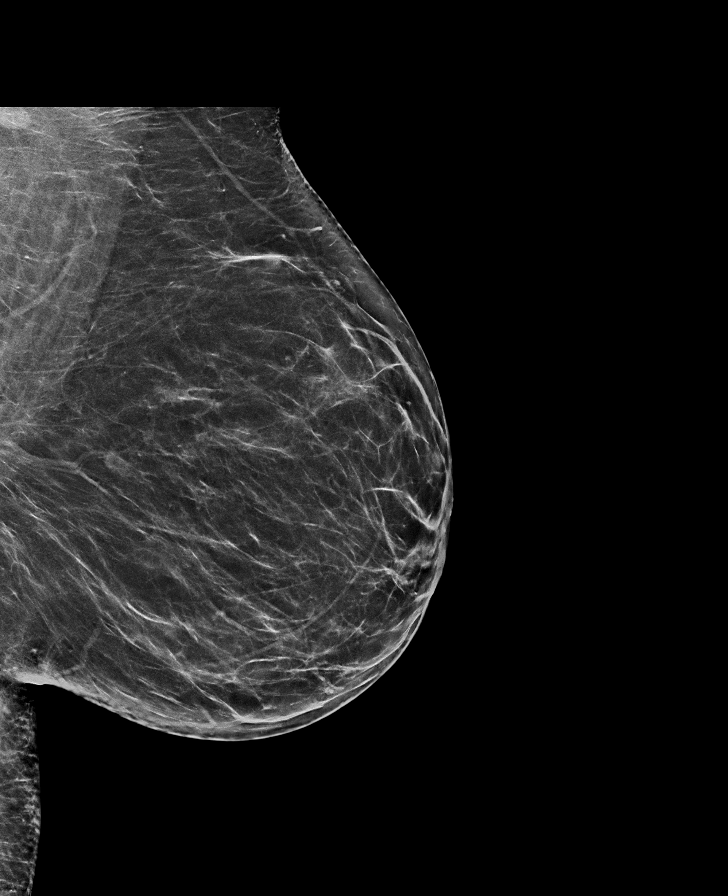

[L CC synth-2D]
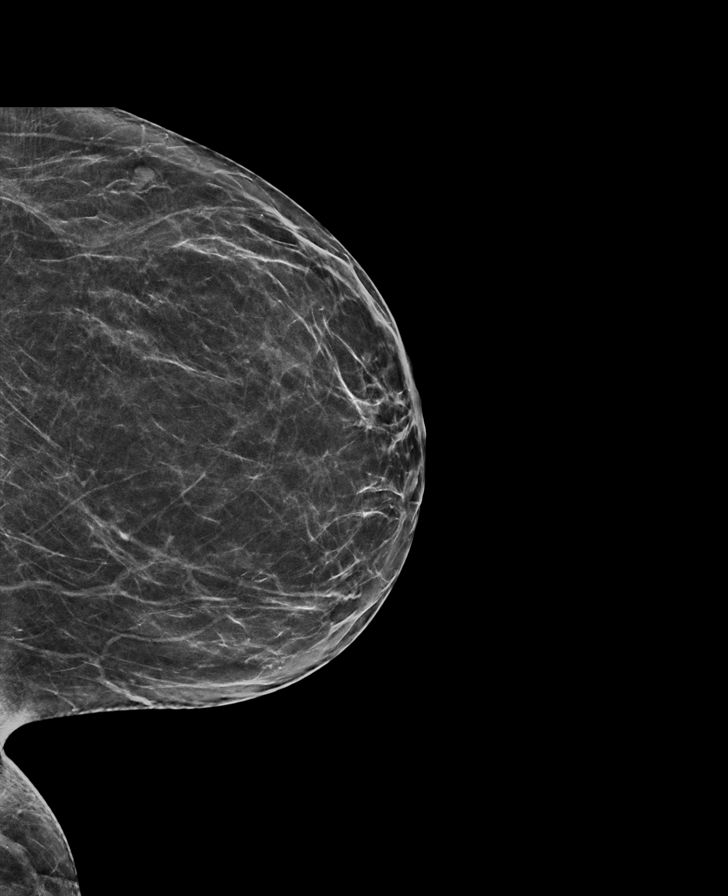

[R MLO synth-2D]
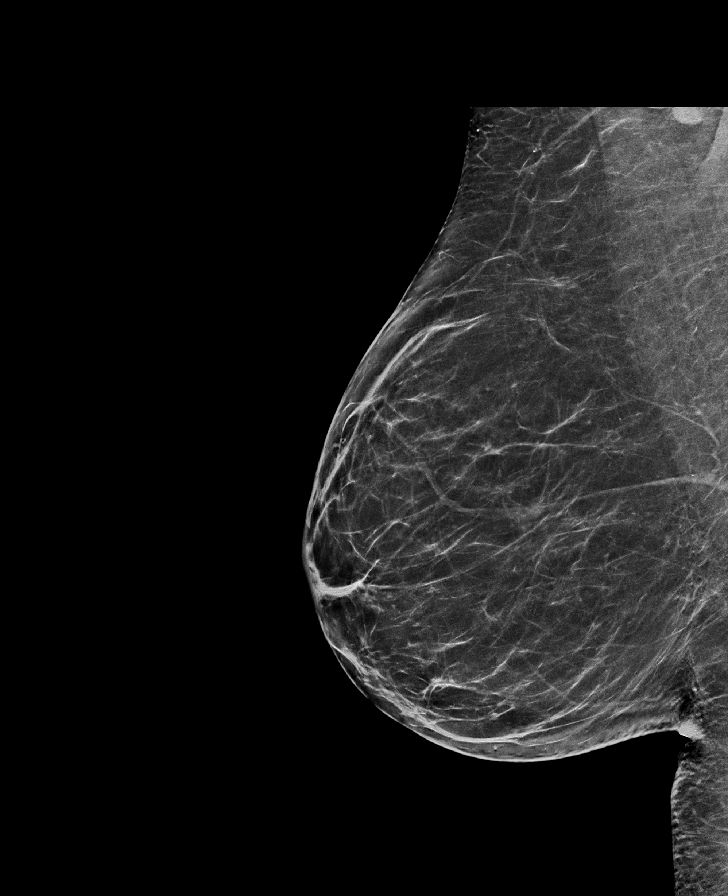

[R CC synth-2D]
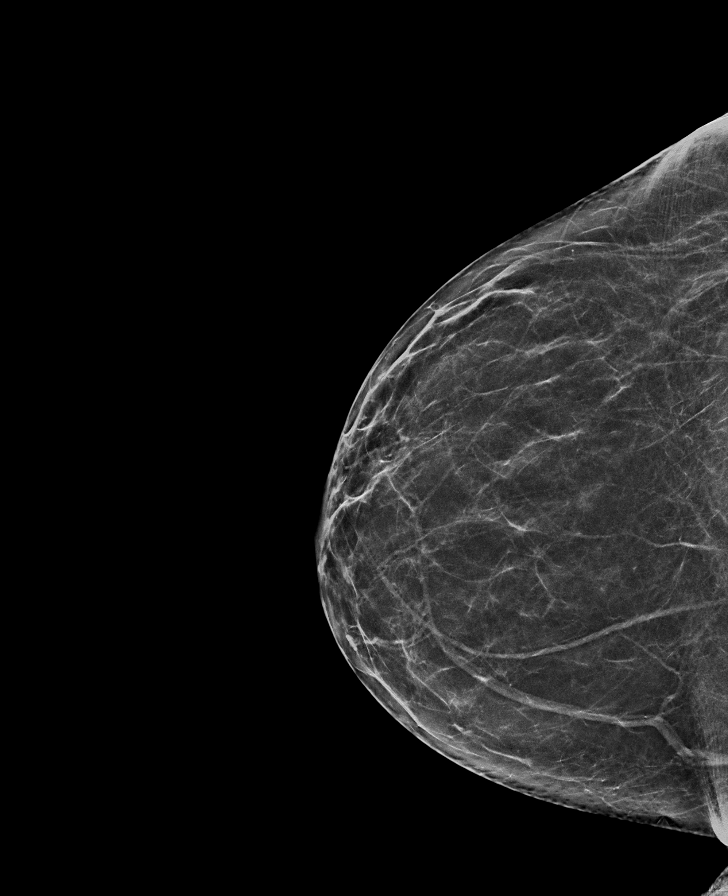

[L MLO tomo · tomo slice 35/69.0]
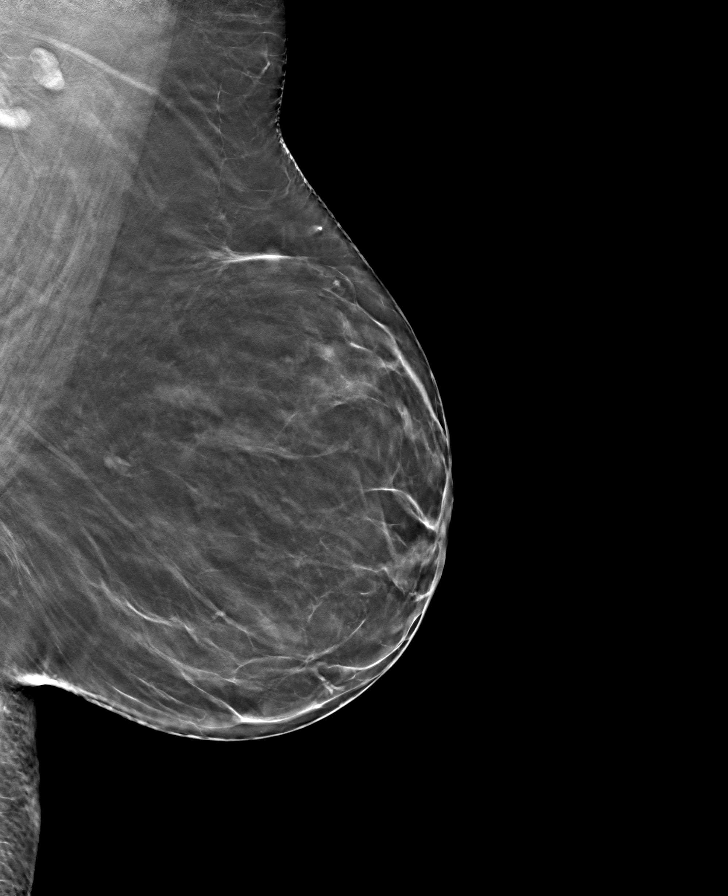

[L CC tomo · tomo slice 33/64.0]
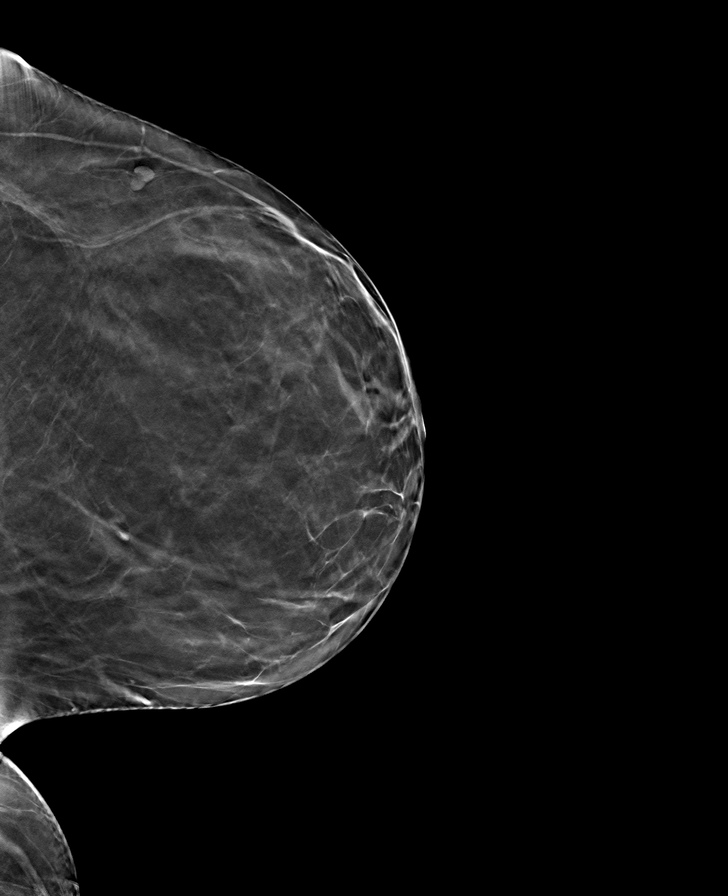

[R MLO tomo · tomo slice 35/68.0]
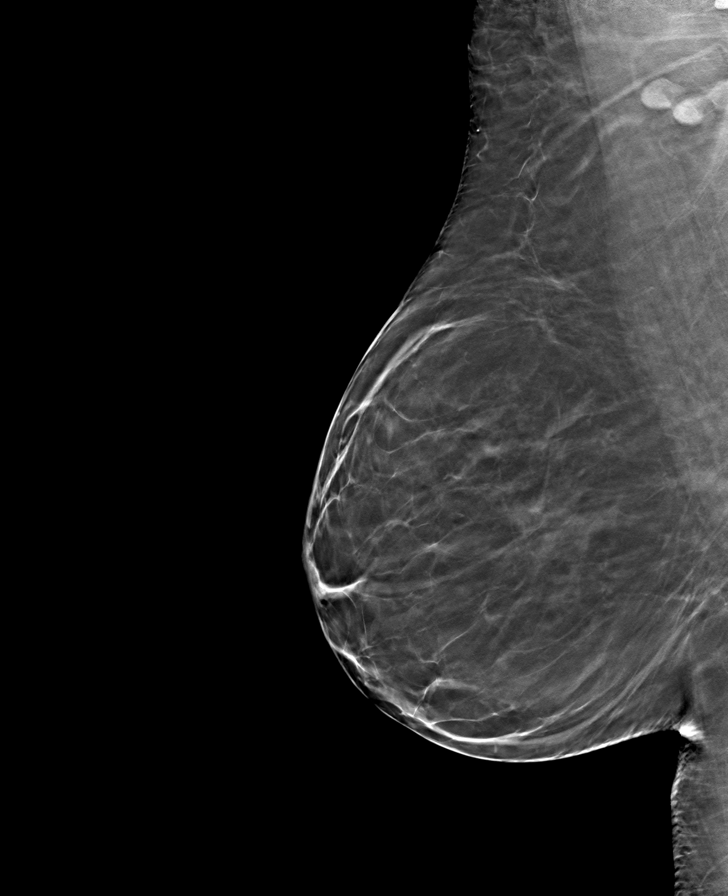

[R CC tomo · tomo slice 32/63.0]
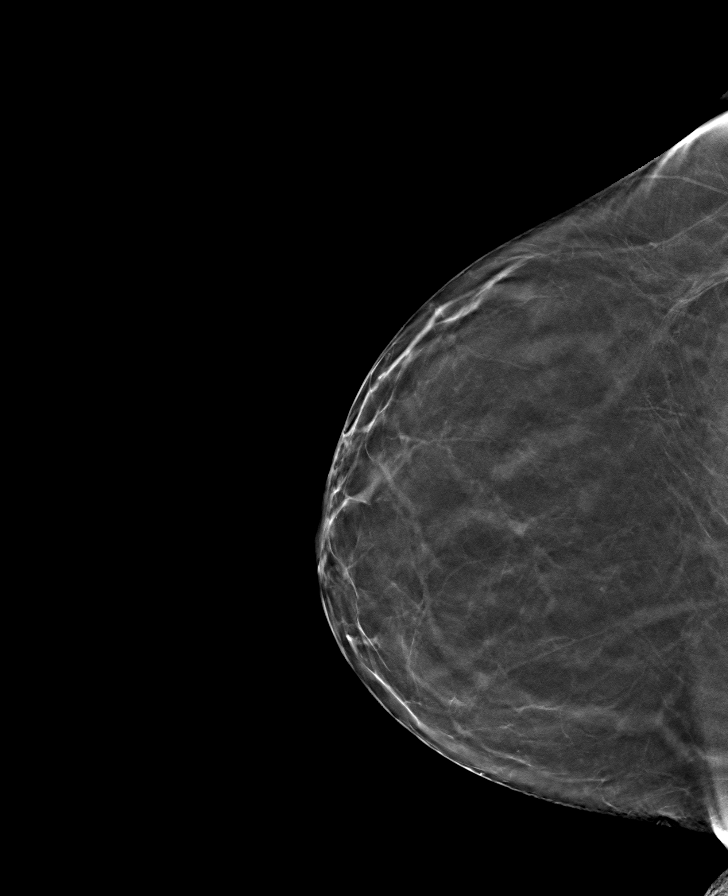

[8 of 24 positions shown; findings below may reference images not displayed]

ACR Breast Density Category b: There are scattered areas of
fibroglandular density.
FINDINGS: There are no findings suspicious for malignancy.
IMPRESSION: No mammographic evidence of malignancy. A result letter of this
screening mammogram will be mailed directly to the patient.

RECOMMENDATION:
Screening mammogram in one year. (Code:XG-X-X7B)

BI-RADS CATEGORY  1: Negative.

## 2022-07-19 ENCOUNTER — Other Ambulatory Visit: Payer: Self-pay | Admitting: Orthopedic Surgery

## 2022-07-19 DIAGNOSIS — M79672 Pain in left foot: Secondary | ICD-10-CM

## 2022-10-16 ENCOUNTER — Ambulatory Visit: Payer: Managed Care, Other (non HMO) | Admitting: Orthopaedic Surgery

## 2022-10-22 ENCOUNTER — Ambulatory Visit: Payer: Managed Care, Other (non HMO) | Admitting: Family Medicine

## 2022-10-22 ENCOUNTER — Encounter: Payer: Self-pay | Admitting: Family Medicine

## 2022-10-22 VITALS — BP 104/64 | HR 77 | Temp 98.4°F | Ht 71.0 in | Wt 229.0 lb

## 2022-10-22 DIAGNOSIS — E559 Vitamin D deficiency, unspecified: Secondary | ICD-10-CM

## 2022-10-22 DIAGNOSIS — F319 Bipolar disorder, unspecified: Secondary | ICD-10-CM

## 2022-10-22 DIAGNOSIS — E669 Obesity, unspecified: Secondary | ICD-10-CM | POA: Insufficient documentation

## 2022-10-22 DIAGNOSIS — E611 Iron deficiency: Secondary | ICD-10-CM

## 2022-10-22 DIAGNOSIS — Z124 Encounter for screening for malignant neoplasm of cervix: Secondary | ICD-10-CM

## 2022-10-22 DIAGNOSIS — E538 Deficiency of other specified B group vitamins: Secondary | ICD-10-CM

## 2022-10-22 DIAGNOSIS — Z6831 Body mass index (BMI) 31.0-31.9, adult: Secondary | ICD-10-CM | POA: Insufficient documentation

## 2022-10-22 DIAGNOSIS — Z862 Personal history of diseases of the blood and blood-forming organs and certain disorders involving the immune mechanism: Secondary | ICD-10-CM

## 2022-10-22 DIAGNOSIS — E66811 Obesity, class 1: Secondary | ICD-10-CM | POA: Insufficient documentation

## 2022-10-22 DIAGNOSIS — E785 Hyperlipidemia, unspecified: Secondary | ICD-10-CM

## 2022-10-22 DIAGNOSIS — M7989 Other specified soft tissue disorders: Secondary | ICD-10-CM | POA: Insufficient documentation

## 2022-10-22 NOTE — Assessment & Plan Note (Signed)
Suspect lipoma, however it is softer than I would expect a lipoma to be.  Ultrasound for further evaluation.  Patient desires removal, will refer to general surgery.

## 2022-10-22 NOTE — Assessment & Plan Note (Signed)
Given history of bariatric surgery, needs labs to assess vitamin B12, vitamin D, iron, etc.  Labs ordered today.

## 2022-10-22 NOTE — Progress Notes (Signed)
Subjective:  Patient ID: Heather Rivas, female    DOB: 12-01-1965  Age: 57 y.o. MRN: [id number]  CC: Establish care  HPI Heather Rivas is a 57 y.o. female presents to the clinic today to establish care.  Patient has bipolar disorder.  Managed by psychiatry.  Doing well on Wellbutrin, gabapentin, and Lamictal.  Patient has a history of gastric bypass.  No recent labs available.  Needs labs.  Patient has a mass to the right clavicle and supraclavicular region.  She states that she has had this for several years.  At least 5 years if not closer to 9.  Has been told that this is a lipoma.  Has never had any imaging or further evaluation.  She states that this is bothersome to her and she would like my medical opinion.  Patient is overdue for cervical cancer screening.  She would like to see an OB/GYN.  PMH, Surgical Hx, Family Hx, Social History reviewed and updated as below.  Past medical history: Obesity, osteoarthritis, migraine, bipolar disorder  Past Surgical History:  Procedure Laterality Date   Cervical laser surgery     COLONOSCOPY N/A 05/02/2015   Procedure: COLONOSCOPY;  Surgeon: Danie Binder, MD;  [facility];  Service: Endoscopy;  Laterality: N/A;  9:30 AM   GASTRIC BYPASS     TONSILLECTOMY     Family History  Problem Relation Age of Onset   Alcohol abuse Father    Breast cancer Maternal Aunt    Alcohol abuse Paternal Uncle    Drug abuse Paternal Uncle    Breast cancer Maternal Grandmother    Social History   Tobacco Use   Smoking status: Former    Packs/day: 0.50    Years: 10.00    Total pack years: 5.00    Types: Cigarettes   Smokeless tobacco: Never  Substance Use Topics   Alcohol use: No    Review of Systems Per HPI  Objective:   Today's Vitals: BP 104/64   Pulse 77   Temp 98.4 F (36.9 C)   Ht '5\' 11"'$  (1.803 m)   Wt 229 lb (103.9 kg)   LMP 06/27/2016   SpO2 99%   BMI 31.94 kg/m   Physical Exam Constitutional:      General:  She is not in acute distress.    Appearance: Normal appearance.  HENT:     Head: Normocephalic and atraumatic.  Eyes:     General:        Right eye: No discharge.        Left eye: No discharge.     Conjunctiva/sclera: Conjunctivae normal.  Cardiovascular:     Rate and Rhythm: Normal rate and regular rhythm.  Pulmonary:     Effort: Pulmonary effort is normal.     Breath sounds: Normal breath sounds. No wheezing, rhonchi or rales.  Skin:         Comments: Large, soft mass noted to the right clavicle and supraclavicular region.  No tenderness to palpation.  No surrounding erythema.  Neurological:     Mental Status: She is alert.  Psychiatric:        Mood and Affect: Mood normal.        Behavior: Behavior normal.      Assessment & Plan:   Problem List Items Addressed This Visit       Other   Soft tissue mass    Suspect lipoma, however it is softer than I would expect a lipoma  to be.  Ultrasound for further evaluation.  Patient desires removal, will refer to general surgery.      Relevant Orders   US Soft Tissue Head/Neck (NON-THYROID)   Class 1 obesity without serious comorbidity with body mass index (BMI) of 31.0 to 31.9 in adult    Given history of bariatric surgery, needs labs to assess vitamin B12, vitamin D, iron, etc.  Labs ordered today.      Relevant Orders   CMP14+EGFR   Hemoglobin A1c   Bipolar disorder ([place]) - Primary    Stable on Lamictal, gabapentin, and Wellbutrin.  Continue follow-up with psychiatry.      Other Visit Diagnoses     History of anemia       Relevant Orders   CBC   Iron deficiency       Relevant Orders   Iron, TIBC and Ferritin Panel   Vitamin D deficiency       Relevant Orders   Vitamin D, 25-hydroxy   Vitamin B12 deficiency       Relevant Orders   Vitamin B12   Hyperlipidemia, unspecified hyperlipidemia type       Relevant Orders   Lipid panel   Cervical cancer screening       Relevant Orders   Ambulatory referral to  Obstetrics / Gynecology       Follow-up: Return in about 6 months (around 04/22/2023).  [place]

## 2022-10-22 NOTE — Assessment & Plan Note (Signed)
Stable on Lamictal, gabapentin, and Wellbutrin.  Continue follow-up with psychiatry.

## 2022-10-22 NOTE — Patient Instructions (Addendum)
Labs ordered.  [place] ordered.  Referral to GYN placed.  We will call with lab results.  Follow up in 6 months.

## 2022-10-31 ENCOUNTER — Ambulatory Visit (HOSPITAL_COMMUNITY): Payer: Managed Care, Other (non HMO) | Attending: Family Medicine

## 2022-11-16 ENCOUNTER — Encounter: Payer: Self-pay | Admitting: Adult Health

## 2022-11-16 ENCOUNTER — Other Ambulatory Visit (HOSPITAL_COMMUNITY)
Admission: RE | Admit: 2022-11-16 | Discharge: 2022-11-16 | Disposition: A | Discharge status: Home / Self Care | Payer: Managed Care, Other (non HMO) | Source: Ambulatory Visit | Attending: Adult Health | Admitting: Adult Health

## 2022-11-16 ENCOUNTER — Ambulatory Visit (INDEPENDENT_AMBULATORY_CARE_PROVIDER_SITE_OTHER): Payer: Managed Care, Other (non HMO) | Admitting: Adult Health

## 2022-11-16 ENCOUNTER — Ambulatory Visit: Payer: Managed Care, Other (non HMO) | Admitting: Orthopaedic Surgery

## 2022-11-16 VITALS — BP 125/85 | HR 64 | Ht 70.0 in | Wt 230.0 lb

## 2022-11-16 DIAGNOSIS — Z01419 Encounter for gynecological examination (general) (routine) without abnormal findings: Secondary | ICD-10-CM

## 2022-11-16 DIAGNOSIS — Z1211 Encounter for screening for malignant neoplasm of colon: Secondary | ICD-10-CM | POA: Diagnosis not present

## 2022-11-16 LAB — HEMOCCULT GUIAC POC 1CARD (OFFICE): Fecal Occult Blood, POC: NEGATIVE

## 2022-11-16 NOTE — Progress Notes (Signed)
Patient ID: Heather Rivas, female   DOB: [birth year], 57 y.o.   MRN: [id number] History of Present Illness: Heather Rivas is a 57 year old white female,married PM in for well woman gyn exam and pap.   PCP is Dr Lacinda Axon   Current Medications, Allergies, Past Medical History, Past Surgical History, Family History and Social History were reviewed in [facility] record.     Review of Systems: Patient denies any daily headaches, hearing loss, fatigue, blurred vision, shortness of breath, chest pain, abdominal pain, problems with bowel movements, urination, or intercourse. No joint pain or mood swings.  Denies any vaginal bleeding +gassy she is sp gastric by pass   Physical Exam:BP 125/85 (BP Location: Left Arm, Patient Position: Sitting, Cuff Size: Normal)   Pulse 64   Ht 5' 10"$  (1.778 m)   Wt 230 lb (104.3 kg)   LMP 06/27/2016   BMI 33.00 kg/m   General:  Well developed, well nourished, no acute distress Skin:  Warm and dry Neck:  Midline trachea, normal thyroid, good ROM, no lymphadenopathy, has ?lipoma above right clavicle(to get [place] per PCP) Lungs; Clear to auscultation bilaterally Breast:  No dominant palpable mass, retraction, or nipple discharge Cardiovascular: Regular rate and rhythm Abdomen:  Soft, non tender, no hepatosplenomegaly Pelvic:  External genitalia is normal in appearance, no lesions.  The vagina is pale. Urethra has no lesions or masses. The cervix is smooth, pap with HR HPV genotyping performed.  Uterus is felt to be normal size, shape, and contour.  No adnexal masses or tenderness noted.Bladder is non tender, no masses felt. Rectal: Good sphincter tone, no polyps, or hemorrhoids felt.  Hemoccult negative. Extremities/musculoskeletal:  No swelling or varicosities noted, no clubbing or cyanosis Psych:  No mood changes, alert and cooperative,seems happy AA is 1    11/16/2022   11:09 AM 10/22/2022    3:09 PM  Depression screen PHQ 2/9  Decreased Interest 0 0   Down, Depressed, Hopeless 1 1  PHQ - 2 Score 1 1  Altered sleeping 0 0  Tired, decreased energy 0 0  Change in appetite 1 1  Feeling bad or failure about yourself  0 1  Trouble concentrating 0 0  Moving slowly or fidgety/restless 0 0  Suicidal thoughts 0 0  PHQ-9 Score 2 3  Difficult doing work/chores  Not difficult at all       11/16/2022   11:10 AM 10/22/2022    3:09 PM  GAD 7 : Generalized Anxiety Score  Nervous, Anxious, on Edge 1 2  Control/stop worrying 1 1  Worry too much - different things 1 0  Trouble relaxing 1 0  Restless 0 1  Easily annoyed or irritable 1 2  Afraid - awful might happen 0 1  Total GAD 7 Score 5 7  Anxiety Difficulty  Not difficult at all    [place] - 11/16/22 1126       Pregnancy Intention Screening   Does the patient want to become pregnant in the next year? N/A    Does the patient's partner want to become pregnant in the next year? N/A    Would the patient like to discuss contraceptive options today? N/A      Contraception Wrap Up   Current Method No Method - Other Reason   postmenopausal   End Method No Method - Other Reason   postmenopausal   Contraception Counseling Provided No  Examination chaperoned by Levy Pupa LPN   Impression and Plan: 1. Encounter for gynecological examination with Papanicolaou smear of cervix Pap sent Pap in 3 years if normal Physical in 1 year Labs with PCP Had negative mammogram 12/04/21 Colonoscopy per GI   2. Encounter for screening fecal occult blood testing Hemoccult was negative

## 2022-11-21 LAB — CYTOLOGY - PAP
Comment: NEGATIVE
Diagnosis: NEGATIVE
High risk HPV: NEGATIVE

## 2022-12-06 ENCOUNTER — Encounter: Payer: Self-pay | Admitting: Radiology

## 2022-12-18 ENCOUNTER — Telehealth: Payer: Self-pay

## 2022-12-18 NOTE — Telephone Encounter (Signed)
Anneelizabeth calling wanting follow up Dr Lacinda Axon had mention doing a referral to a surgeon for ultra sound on neck voice mail was left at the front.  Call back [phone] [facility]

## 2022-12-20 NOTE — Telephone Encounter (Signed)
Heather Rivas G, DO     I put in the [place]. Can we get someone to schedule this please?

## 2022-12-20 NOTE — Telephone Encounter (Signed)
Message sent to referral coordinator to check on status.

## 2022-12-24 NOTE — Telephone Encounter (Addendum)
Per referral coordinator:   She was scheduled on 1/24 and she was a No Show  If she needs to reschedule it she can call scheduling at [phone] option 3     Patient notified and verbalized understanding and stated she will call to reschedule

## 2023-02-26 ENCOUNTER — Other Ambulatory Visit (HOSPITAL_COMMUNITY): Payer: Self-pay | Admitting: Family Medicine

## 2023-02-26 DIAGNOSIS — Z1231 Encounter for screening mammogram for malignant neoplasm of breast: Secondary | ICD-10-CM

## 2023-03-25 ENCOUNTER — Ambulatory Visit (HOSPITAL_COMMUNITY)
Admission: RE | Admit: 2023-03-25 | Discharge: 2023-03-25 | Disposition: A | Discharge status: Home / Self Care | Payer: Self-pay | Source: Ambulatory Visit | Attending: Family Medicine | Admitting: Family Medicine

## 2023-03-25 ENCOUNTER — Encounter (HOSPITAL_COMMUNITY): Payer: Self-pay

## 2023-03-25 DIAGNOSIS — Z1231 Encounter for screening mammogram for malignant neoplasm of breast: Secondary | ICD-10-CM | POA: Insufficient documentation

## 2023-04-22 ENCOUNTER — Ambulatory Visit: Payer: Managed Care, Other (non HMO) | Admitting: Family Medicine

## 2023-04-22 ENCOUNTER — Ambulatory Visit (HOSPITAL_COMMUNITY): Admission: RE | Admit: 2023-04-22 | Payer: Managed Care, Other (non HMO) | Source: Ambulatory Visit

## 2023-05-16 ENCOUNTER — Ambulatory Visit: Payer: Self-pay | Admitting: Family Medicine

## 2023-06-19 ENCOUNTER — Ambulatory Visit: Payer: Self-pay | Admitting: Family Medicine

## 2023-12-03 ENCOUNTER — Other Ambulatory Visit (HOSPITAL_COMMUNITY): Payer: Self-pay | Admitting: Family Medicine

## 2023-12-03 DIAGNOSIS — Z1231 Encounter for screening mammogram for malignant neoplasm of breast: Secondary | ICD-10-CM

## 2024-03-03 ENCOUNTER — Ambulatory Visit (INDEPENDENT_AMBULATORY_CARE_PROVIDER_SITE_OTHER): Payer: Self-pay | Admitting: Family Medicine

## 2024-03-03 ENCOUNTER — Encounter: Payer: Self-pay | Admitting: Family Medicine

## 2024-03-03 VITALS — BP 114/77 | Temp 98.2°F | Ht 70.0 in | Wt 201.0 lb

## 2024-03-03 DIAGNOSIS — Z0000 Encounter for general adult medical examination without abnormal findings: Secondary | ICD-10-CM | POA: Insufficient documentation

## 2024-03-03 MED ORDER — ARIPIPRAZOLE 15 MG PO TABS: 15.0000 mg | ORAL_TABLET | Freq: Every day | ORAL | 3 refills | Status: AC

## 2024-03-03 MED ORDER — GABAPENTIN 300 MG PO CAPS: 300.0000 mg | ORAL_CAPSULE | Freq: Three times a day (TID) | ORAL | 3 refills | Status: AC

## 2024-03-03 NOTE — Patient Instructions (Signed)
 Medications refilled.   When you get insurance straightened out, please let me know and we will order routine labs.  Take care  Dr. Debrah Fan

## 2024-03-03 NOTE — Progress Notes (Signed)
 Subjective:  Patient ID: Heather Rivas, female    DOB: 12/20/1965  Age: 58 y.o. MRN: [id number]  CC:   Chief Complaint  Patient presents with   Annual Exam    HPI:  58 year old female presents for an annual exam.  Patient in need of several preventative healthcare items: HIV screening, hepatitis C screening, Tdap.  Needs screening blood work as well.  However, patient currently having financial difficulty and having issues with her insurance.  She therefore does not want to pursue these items today.  Patient states that she is overall doing okay.  She states that it is cost prohibitive for her to see her psychiatrist.  She would like me to refill her chronic medications of gabapentin and Abilify.  She states that these work well for her.  Patient Active Problem List   Diagnosis Date Noted   Annual physical exam 03/03/2024   Soft tissue mass 10/22/2022   Primary osteoarthritis of first carpometacarpal joint of left hand 04/17/2022   Migraine without aura and without status migrainosus, not intractable 11/07/2016   Bipolar disorder (HCC) 06/26/2013    Social Hx   Social History   Socioeconomic History   Marital status: Married    Spouse name: Not on file   Number of children: Not on file   Years of education: Not on file   Highest education level: Not on file  Occupational History   Not on file  Tobacco Use   Smoking status: Former    Current packs/day: 0.50    Average packs/day: 0.5 packs/day for 10.0 years (5.0 ttl pk-yrs)    Types: Cigarettes   Smokeless tobacco: Never  Vaping Use   Vaping status: Some Days  Substance and Sexual Activity   Alcohol use: No   Drug use: Not Currently    Comment: occasionally   Sexual activity: Yes    Birth control/protection: Post-menopausal  Other Topics Concern   Not on file  Social History Narrative   Not on file   Social Drivers of Health   Financial Resource Strain: Low Risk  (11/16/2022)   Overall Financial Resource  Strain (CARDIA)    Difficulty of Paying Living Expenses: Not very hard  Food Insecurity: Food Insecurity Present (11/16/2022)   Hunger Vital Sign    Worried About Running Out of Food in the Last Year: Never true    Ran Out of Food in the Last Year: Sometimes true  Transportation Needs: No Transportation Needs (11/16/2022)   PRAPARE - Administrator, Civil Service (Medical): No    Lack of Transportation (Non-Medical): No  Physical Activity: Insufficiently Active (11/16/2022)   Exercise Vital Sign    Days of Exercise per Week: 2 days    Minutes of Exercise per Session: 10 min  Stress: No Stress Concern Present (11/16/2022)   [employer] of Occupational Health - Occupational Stress Questionnaire    Feeling of Stress : Only a little  Social Connections: Unknown (11/16/2022)   Social Connection and Isolation Panel [NHANES]    Frequency of Communication with Friends and Family: More than three times a week    Frequency of Social Gatherings with Friends and Family: Once a week    Attends Religious Services: More than 4 times per year    Active Member of [employer] or Organizations: Patient declined    Attends Banker Meetings: Patient declined    Marital Status: Patient declined    Review of Systems Per HPI  Objective:  BP  114/77   Temp 98.2 F (36.8 C)   Ht 5\' 10"  (1.778 m)   Wt 201 lb (91.2 kg)   LMP 06/27/2016   SpO2 99%   BMI 28.84 kg/m      03/03/2024    9:14 AM 11/16/2022   11:08 AM 10/22/2022    2:54 PM  BP/Weight  Systolic BP 114 125 104  Diastolic BP 77 85 64  Wt. (Lbs) 201 230 229  BMI 28.84 kg/m2 33 kg/m2 31.94 kg/m2    Physical Exam Vitals and nursing note reviewed.  Constitutional:      General: She is not in acute distress. HENT:     Head: Normocephalic and atraumatic.     Nose: Nose normal.  Eyes:     General:        Right eye: No discharge.        Left eye: No discharge.     Conjunctiva/sclera: Conjunctivae normal.  Cardiovascular:      Rate and Rhythm: Normal rate and regular rhythm.  Pulmonary:     Effort: Pulmonary effort is normal.     Breath sounds: Normal breath sounds.  Abdominal:     General: There is no distension.     Palpations: Abdomen is soft.     Tenderness: There is no abdominal tenderness.  Neurological:     General: No focal deficit present.     Mental Status: She is alert.  Psychiatric:        Mood and Affect: Mood normal.        Behavior: Behavior normal.     Lab Results  Component Value Date   WBC 6.8 08/20/2017   HGB 11.1 (L) 08/20/2017   HCT 35.3 (L) 08/20/2017   PLT 359 08/20/2017   GLUCOSE 94 08/20/2017   NA 138 08/20/2017   K 3.8 08/20/2017   CL 105 08/20/2017   CREATININE 0.83 08/20/2017   BUN 19 08/20/2017   CO2 24 08/20/2017     Assessment & Plan:  Annual physical exam Assessment & Plan: Holding off on preventative healthcare items due to cost.  Holding off on labs as well. Overall she is stable.  Medications refilled.   Other orders -     ARIPiprazole; Take 1 tablet (15 mg total) by mouth daily.  Dispense: 90 tablet; Refill: 3 -     Gabapentin; Take 1 capsule (300 mg total) by mouth 3 (three) times daily.  Dispense: 270 capsule; Refill: 3    Follow-up:  Return in about 1 year (around 03/03/2025).  Kathleen Papa DO [facility]

## 2024-03-03 NOTE — Assessment & Plan Note (Signed)
 Holding off on preventative healthcare items due to cost.  Holding off on labs as well. Overall she is stable.  Medications refilled.

## 2024-04-13 ENCOUNTER — Ambulatory Visit (HOSPITAL_COMMUNITY): Payer: Self-pay
